# Patient Record
Sex: Male | Born: 1975 | Race: Black or African American | Hispanic: No | Marital: Married | Smoking: Former smoker
Health system: Southern US, Community
[De-identification: ages and names within clinical notes are randomized; demographics above are authoritative.]

---

## 2007-04-12 ENCOUNTER — Ambulatory Visit: Payer: Self-pay | Admitting: Family Medicine

## 2007-04-12 DIAGNOSIS — R3129 Other microscopic hematuria: Secondary | ICD-10-CM

## 2007-04-12 LAB — CONVERTED CEMR LAB
Albumin/Creatinine Ratio, Urine, POC: <30
Bilirubin Urine: NEGATIVE
Blood Glucose, Fasting: 78 mg/dL
Hgb A1c MFr Bld: 6.2 %
Ketones, urine, test strip: NEGATIVE
Nitrite: NEGATIVE
WBC Urine, dipstick: NEGATIVE
pH: 5.5

## 2007-04-13 ENCOUNTER — Encounter: Payer: Self-pay | Admitting: Family Medicine

## 2007-04-13 LAB — CONVERTED CEMR LAB
ALT: 25 units/L (ref 0–53)
AST: 26 units/L (ref 0–37)
Albumin: 4.3 g/dL (ref 3.5–5.2)
CO2: 22 meq/L (ref 19–32)
Calcium: 9.8 mg/dL (ref 8.4–10.5)
Chloride: 103 meq/L (ref 96–112)
Cholesterol: 180 mg/dL (ref 0–200)
Potassium: 4.4 meq/L (ref 3.5–5.3)
TSH: 2.415 microintl units/mL (ref 0.350–5.50)

## 2007-04-14 LAB — CONVERTED CEMR LAB
Bacteria, UA: NONE SEEN
RBC / HPF: NONE SEEN (ref <3)
WBC, UA: NONE SEEN cells/hpf (ref <3)

## 2007-07-02 ENCOUNTER — Encounter: Payer: Self-pay | Admitting: Family Medicine

## 2007-07-16 ENCOUNTER — Ambulatory Visit: Payer: Self-pay | Admitting: Family Medicine

## 2007-07-16 DIAGNOSIS — D72819 Decreased white blood cell count, unspecified: Secondary | ICD-10-CM | POA: Insufficient documentation

## 2007-07-16 LAB — CONVERTED CEMR LAB

## 2007-07-27 ENCOUNTER — Encounter: Payer: Self-pay | Admitting: Family Medicine

## 2007-07-28 LAB — CONVERTED CEMR LAB
Basophils Absolute: 0 10*3/uL (ref 0.0–0.1)
Basophils Relative: 0 % (ref 0–1)
Eosinophils Absolute: 0.2 10*3/uL (ref 0.0–0.7)
MCHC: 33.5 g/dL (ref 30.0–36.0)
MCV: 80.3 fL (ref 78.0–100.0)
Monocytes Relative: 13 % — ABNORMAL HIGH (ref 3–12)
Neutro Abs: 1.4 10*3/uL — ABNORMAL LOW (ref 1.7–7.7)
Neutrophils Relative %: 41 % — ABNORMAL LOW (ref 43–77)
Platelets: 221 10*3/uL (ref 150–400)
RDW: 13.5 % (ref 11.5–15.5)

## 2009-10-25 ENCOUNTER — Ambulatory Visit: Payer: Self-pay | Admitting: Family Medicine

## 2009-10-25 DIAGNOSIS — R0789 Other chest pain: Secondary | ICD-10-CM | POA: Insufficient documentation

## 2009-10-26 ENCOUNTER — Encounter: Payer: Self-pay | Admitting: Family Medicine

## 2009-10-28 LAB — CONVERTED CEMR LAB
Albumin: 3.8 g/dL (ref 3.5–5.2)
Alkaline Phosphatase: 50 units/L (ref 39–117)
Calcium: 9.1 mg/dL (ref 8.4–10.5)
Chloride: 105 meq/L (ref 96–112)
Glucose, Bld: 92 mg/dL (ref 70–99)
LDL Cholesterol: 89 mg/dL (ref 0–99)
MCHC: 35 g/dL (ref 30.0–36.0)
Potassium: 4.3 meq/L (ref 3.5–5.3)
RBC: 5.32 M/uL (ref 4.22–5.81)
Sodium: 137 meq/L (ref 135–145)
Total Protein: 7.2 g/dL (ref 6.0–8.3)
Triglycerides: 231 mg/dL — ABNORMAL HIGH (ref <150)
WBC: 3.2 10*3/uL — ABNORMAL LOW (ref 4.0–10.5)

## 2009-10-29 ENCOUNTER — Encounter: Payer: Self-pay | Admitting: Family Medicine

## 2010-02-12 NOTE — Assessment & Plan Note (Signed)
Summary: Fasting CPE   Vital Signs:  Patient profile:   35 year old male Height:      71 inches Weight:      300 pounds BMI:     41.99 Pulse rate:   82 / minute BP sitting:   124 / 68  (right arm) Cuff size:   large  Vitals Entered By: Avon Gully CMA, Duncan Dull) (October 25, 2009 11:12 AM) CC: CPE   Primary Care Provider:  Linford Arnold, c  CC:  CPE.  History of Present Illness: Here for CPE.  Occ SOBclimbing up and down start.  Occ CP. No assoc with the SOB. Occ will get the left side of shooin pain just for a second. Can happen at rest or acitivity. Occ gets heartburn.   Current Medications (verified): 1)  None  Allergies (verified): No Known Drug Allergies  Comments:  Nurse/Medical Assistant: The patient's medications and allergies were reviewed with the patient and were updated in the Medication and Allergy Lists. Avon Gully CMA, Duncan Dull) (October 25, 2009 11:12 AM)  Past History:  Past Surgical History: Last updated: 04/12/2007 None  Family History: Reviewed history from 04/12/2007 and no changes required. Mother with Cancer Uncle with MI Father, sister with DM Father, sister, brother with HTN  Social History: Foreman  job.  HS degree. Married to [employer] with 5 children.  He doesn't have any kids with his current wife.   Former Smoker Alcohol use-no Drug use-no Regular exercise-no  Review of Systems       The patient complains of weight loss and chest pain.  The patient denies anorexia, fever, weight gain, vision loss, decreased hearing, syncope, dyspnea on exertion, headaches, hemoptysis, abdominal pain, melena, severe indigestion/heartburn, hematuria, incontinence, muscle weakness, suspicious skin lesions, difficulty walking, and depression.         Occ SOBclimbing up and down start.  Occ CP. No assoc with the SOB. Occ will get the left side of shooin pain just for a second. Can happen at rest or acitivity. Occ gets heartburn.   Physical  Exam  General:  Well-developed,well-nourished,in no acute distress; alert,appropriate and cooperative throughout examination Head:  Normocephalic and atraumatic without obvious abnormalities. No apparent alopecia or balding. Eyes:  No corneal or conjunctival inflammation noted. EOMI. Perrla.  Ears:  External ear exam shows no significant lesions or deformities.  Otoscopic examination reveals clear canals, tympanic membranes are intact bilaterally without bulging, retraction, inflammation or discharge. Hearing is grossly normal bilaterally. Nose:  External nasal examination shows no deformity or inflammation. Nasal mucosa are pink and moist without lesions or exudates. Mouth:  Oral mucosa and oropharynx without lesions or exudates.  Teeth in good repair. Neck:  No deformities, masses, or tenderness noted. Chest Wall:  No deformities, masses, tenderness or gynecomastia noted. Lungs:  Normal respiratory effort, chest expands symmetrically. Lungs are clear to auscultation, no crackles or wheezes. Heart:  Normal rate and regular rhythm. S1 and S2 normal without gallop, murmur, click, rub or other extra sounds. NO carotid or abdominal bruits.  Abdomen:  Bowel sounds positive,abdomen soft and non-tender without masses, organomegaly or hernias noted. Msk:  No deformity or scoliosis noted of thoracic or lumbar spine.  Nodule on the first finger of the right hand B/T the MCP and theh PIP joint.  It is mobile and notender and very round.  Pulses:  R and L carotid,radial,dorsalis pedis and posterior tibial pulses are full and equal bilaterally Extremities:  No clubbing, cyanosis, edema, or deformity noted with normal  full range of motion of all joints.   Neurologic:  No cranial nerve deficits noted. Station and gait are normal. Plantar reflexes are down-going bilaterally. DTRs are symmetrical throughout. Sensory, motor and coordinative functions appear intact. Skin:  Intact without suspicious lesions or  rashes Cervical Nodes:  No lymphadenopathy noted Psych:  Cognition and judgment appear intact. Alert and cooperative with normal attention span and concentration. No apparent delusions, illusions, hallucinations   Impression & Recommendations:  Problem # 1:  HEALTH MAINTENANCE EXAM (ICD-V70.0)  Encouraged weight loss and regular exercise. He has gainedl 22 lbs since I last saw him.  Tdap and flu vaccines given.  Orders: T-Comprehensive Metabolic Panel [id number]) T-Lipid Profile ([id number]) T-CBC No Diff ([id number]) EKG w/ Interpretation (93000)  Reviewed preventive care protocols, scheduled due services, and updated immunizations.  Problem # 2:  CHEST PAIN, ATYPICAL (ICD-786.59) No consistant with heart disease. If sxs change or become more frequent then follow up.  EKG shows NSR, rate of 72 bpm.  Likely his GERD.  Discussed dietary measures and can start Zantac two times a day if needed.  Orders: T-CBC No Diff ([id number]) EKG w/ Interpretation (93000)  Other Orders: Flu Vaccine 64yrs + (28315) Admin 1st Vaccine (17616) Tdap => 41yrs IM (07371) Admin of Any Addtl Vaccine (06269)  Patient Instructions: 1)  It is important that you exercise reguarly at least 20 minutes 5 times a week. If you develop chest pain, have severe difficulty breathing, or feel very tired, stop exercising immediately and seek medical attention.  2)  You need to lose weight. Consider a lower calorie diet and regular exercise.  3)  We will call you  with your lab results.  4)  For your reflux avoid greasy, spicey foods and carbonated beverages and caffine. Can start zantac two times a day for symptoms if dietary changes alone don't help.     Immunizations Administered:  Influenza Vaccine # 1:    Vaccine Type: Fluvax 3+    Site: left deltoid    Mfr: GlaxoSmithKline    Dose: 0.5 ml    Route: IM    Given by: Sue Lush McCrimmon CMA, (AAMA)    Exp. Date: 07/13/2010    Lot #: [id number]    VIS  given: 08/07/09 version given October 25, 2009.  Tetanus Vaccine:    Vaccine Type: Tdap    Site: right deltoid    Mfr: GlaxoSmithKline    Dose: 0.5 ml    Route: IM    Given by: Avon Gully CMA, (AAMA)    VIS given: 12/01/07 version given October 25, 2009.  Flu Vaccine Consent Questions:    Do you have a history of severe allergic reactions to this vaccine? no    Any prior history of allergic reactions to egg and/or gelatin? no    Do you have a sensitivity to the preservative Thimersol? no    Do you have a past history of Guillan-Barre Syndrome? no    Do you currently have an acute febrile illness? no    Have you ever had a severe reaction to latex? no    Vaccine information given and explained to patient? no

## 2010-02-12 NOTE — Letter (Signed)
Summary: Generic Letter  [facility] [place]  [address]   [place], [place] [address]   Phone: [phone]  Fax: [phone]    10/29/2009  Manuel Ayers [address] [place], [place]  [address]  Dear [name],    We have received your lab results back and tried to reach you but number has been disconnected.  Your labs are OK except your triglycerides are high and your good cholesterol (HDL) is low, although they are slightly better than in 2009. I strongly recommend that you start Fish Oil 1000mg  capsules 3-4 capsules daily for this- these are over the counter. Lets recheck these labs in 6 months.  Please contact our office with a new contact number for you.       Sincerely,   Nani Gasser, MD

## 2010-09-02 ENCOUNTER — Encounter: Payer: Self-pay | Admitting: Family Medicine

## 2010-09-05 ENCOUNTER — Ambulatory Visit (INDEPENDENT_AMBULATORY_CARE_PROVIDER_SITE_OTHER): Payer: Managed Care, Other (non HMO) | Admitting: Family Medicine

## 2010-09-05 ENCOUNTER — Encounter: Payer: Self-pay | Admitting: Family Medicine

## 2010-09-05 ENCOUNTER — Telehealth: Payer: Self-pay | Admitting: Family Medicine

## 2010-09-05 DIAGNOSIS — Z833 Family history of diabetes mellitus: Secondary | ICD-10-CM

## 2010-09-05 DIAGNOSIS — I10 Essential (primary) hypertension: Secondary | ICD-10-CM

## 2010-09-05 DIAGNOSIS — R1013 Epigastric pain: Secondary | ICD-10-CM

## 2010-09-05 DIAGNOSIS — Z13220 Encounter for screening for lipoid disorders: Secondary | ICD-10-CM

## 2010-09-05 MED ORDER — OMEPRAZOLE 20 MG PO CPDR: 20.0000 mg | DELAYED_RELEASE_CAPSULE | Freq: Every day | ORAL | Status: DC

## 2010-09-05 MED ORDER — LISINOPRIL 20 MG PO TABS: 20.0000 mg | ORAL_TABLET | Freq: Every day | ORAL | Status: DC

## 2010-09-05 NOTE — Telephone Encounter (Signed)
Call pt: let hime know I sent over omeprazole for his stomach. Sorry sent to late.

## 2010-09-05 NOTE — Progress Notes (Signed)
  Subjective:    Patient ID: Manuel Ayers, male    DOB: Mar 16, 1975, 35 y.o.   MRN: [id number]  HPI Burning sensation in the epigastric area after ate that lasted 3 days. No Nausea. Says hx of ulcer. Had green stools about 1.5 weeks before the stomach pain.  No Diarrhea or constipation.  This week hasn't had pain. No recent NSAIDs.  Has been having a lot of gas daily for the last 1-2 weeks.   No fever or vomiting.  No worsening or alleviating sxs. Says pain did start after eating kraft cheese. Says no prior hx or lactose intolerance.    Also elevated BP.  He says BP has been running in the 150s for several weeks. Says today's BP is the best it has been. +fam hx.  Tried to start phentermine for weiht loss and says had to stop it becaues caused him to have tremor and head started throbbing and says his BP was out of control. No hx thyroid dx  Review of Systems     BP 139/89  Pulse 73  Wt 286 lb (129.729 kg)    No Known Allergies  History reviewed. No pertinent past medical history.  History reviewed. No pertinent past surgical history.  History   Social History  . Marital Status: Married    Spouse Name: N/A    Number of Children: N/A  . Years of Education: N/A   Occupational History  . Not on file.   Social History Main Topics  . Smoking status: Former Games developer  . Smokeless tobacco: Not on file  . Alcohol Use: No  . Drug Use: No  . Sexually Active:      no rerular exercise   Other Topics Concern  . Not on file   Social History Narrative  . No narrative on file    Family History  Problem Relation Age of Onset  . Cancer Mother   . Heart attack      uncle  . Diabetes Father   . Hypertension Father   . Diabetes Sister   . Hypertension Sister   . Hypertension Brother   . Kidney failure Sister     Mr. Sara does not currently have medications on file.  Objective:   Physical Exam  Constitutional: He is oriented to person, place, and time. He appears well-developed and  well-nourished.  HENT:  Head: Normocephalic and atraumatic.  Cardiovascular: Normal rate, regular rhythm and normal heart sounds.   Pulmonary/Chest: Effort normal and breath sounds normal.  Abdominal: Soft. Bowel sounds are normal. He exhibits no distension and no mass. There is tenderness. There is no rebound and no guarding.       Diffuse tenderness with deep palpation.    Neurological: He is alert and oriented to person, place, and time.  Skin: Skin is warm and dry.  Psychiatric: He has a normal mood and affect. His behavior is normal.          Assessment & Plan:  Epigastric Pain -resolved but very tender on exam today. Consider diverticulitis. Will check WBC.  Consider GERD as well. STart PPI and taper after 6 weeks. Also conisder mild case of colitis that is resolving on its own. If pain recurs consider cholecystitis.

## 2010-09-05 NOTE — Assessment & Plan Note (Signed)
New dx. Discussed start lisinopril. Discussed potential SE.  rec weight loss, exercise, low salt diet. REc DASH diet. He says he thinks his wife has a copy at home.

## 2010-09-06 ENCOUNTER — Other Ambulatory Visit: Payer: Self-pay | Admitting: Family Medicine

## 2010-09-06 ENCOUNTER — Telehealth: Payer: Self-pay | Admitting: Family Medicine

## 2010-09-06 DIAGNOSIS — R1013 Epigastric pain: Secondary | ICD-10-CM

## 2010-09-06 LAB — COMPLETE METABOLIC PANEL WITH GFR
Albumin: 4.3 g/dL (ref 3.5–5.2)
BUN: 15 mg/dL (ref 6–23)
CO2: 24 mEq/L (ref 19–32)
Calcium: 9.5 mg/dL (ref 8.4–10.5)
Chloride: 107 mEq/L (ref 96–112)
GFR, Est Non African American: >60 mL/min (ref >60)
Glucose, Bld: 85 mg/dL (ref 70–99)
Potassium: 4.2 mEq/L (ref 3.5–5.3)

## 2010-09-06 LAB — CBC WITH DIFFERENTIAL/PLATELET
HCT: 43.1 % (ref 39.0–52.0)
Hemoglobin: 14.6 g/dL (ref 13.0–17.0)
Lymphocytes Relative: 40 % (ref 12–46)
Monocytes Absolute: 0.5 10*3/uL (ref 0.1–1.0)
Monocytes Relative: 12 % (ref 3–12)
Neutro Abs: 1.8 10*3/uL (ref 1.7–7.7)
WBC: 4 10*3/uL (ref 4.0–10.5)

## 2010-09-06 LAB — TSH: TSH: 2.693 u[IU]/mL (ref 0.350–4.500)

## 2010-09-06 LAB — GLUCOSE, RANDOM: Glucose, Bld: 81 mg/dL (ref 70–99)

## 2010-09-06 LAB — LIPID PANEL: HDL: 38 mg/dL — ABNORMAL LOW (ref >39)

## 2010-09-06 NOTE — Telephone Encounter (Signed)
Called home number and its d/c.called cell and not sure if this is correct number

## 2010-09-06 NOTE — Telephone Encounter (Signed)
Call patient: Complete metabolic panel is normal.His liver enzymes are normal. His complete blood count was normal showing that there is no sign of infection which is very reassuring. Thyroid was normal as well.

## 2010-09-06 NOTE — Telephone Encounter (Signed)
Called both numbers and i think they are the wrong num

## 2010-09-07 ENCOUNTER — Telehealth: Payer: Self-pay | Admitting: Family Medicine

## 2010-09-07 NOTE — Telephone Encounter (Signed)
Call pt: TG are better than last time and HDL is better, but LDL is a little worse. Continue to work on exercise, and diet and weight loss and recheck in 1 year.  Complete blood count is normal. Blood sugar and thyroid look great. CMPis normal as well. H pylori test is not back yet.

## 2010-09-09 ENCOUNTER — Telehealth: Payer: Self-pay | Admitting: Family Medicine

## 2010-09-09 MED ORDER — CLARITHROMYCIN 500 MG PO TABS: 500.0000 mg | ORAL_TABLET | Freq: Two times a day (BID) | ORAL | Status: AC

## 2010-09-09 MED ORDER — AMOXICILLIN 500 MG PO TABS: 1000.0000 mg | ORAL_TABLET | Freq: Two times a day (BID) | ORAL | Status: AC

## 2010-09-09 NOTE — Telephone Encounter (Signed)
Call pt: He is + for helicobacter pylori. Will call in tx. The tx is 3 meds. One is the omeprazole so make sure taking that every day while on the 2 antiobiotics for 10 days.

## 2010-09-11 NOTE — Telephone Encounter (Signed)
Pt.notified

## 2010-09-11 NOTE — Telephone Encounter (Signed)
Pt called and was notified of results and changed his contact info in comp

## 2010-09-11 NOTE — Telephone Encounter (Signed)
LMOM (cellular #) to call office to get lab results.  Did not leave results on voice mail since the greeting states someone else name.  Home # is saying it is no longer in service. Jarvis Newcomer, LPN Domingo Dimes

## 2010-09-11 NOTE — Telephone Encounter (Signed)
Pt has been notified.

## 2010-10-21 ENCOUNTER — Other Ambulatory Visit: Payer: Self-pay | Admitting: Family Medicine

## 2010-10-21 MED ORDER — LISINOPRIL 20 MG PO TABS: 20.0000 mg | ORAL_TABLET | Freq: Every day | ORAL | Status: DC

## 2010-10-21 NOTE — Telephone Encounter (Signed)
Pt;s wife called for refill of pts med lisinopril 20 mg.  Needs it sent to [facility] [place], [place].   Plan:  Refill sent for # 30/ 1 refill and pt wife informed. Jarvis Newcomer, LPN Domingo Dimes

## 2010-11-30 ENCOUNTER — Other Ambulatory Visit: Payer: Self-pay | Admitting: Family Medicine

## 2011-01-10 ENCOUNTER — Ambulatory Visit (INDEPENDENT_AMBULATORY_CARE_PROVIDER_SITE_OTHER): Payer: Managed Care, Other (non HMO) | Admitting: Family Medicine

## 2011-01-10 ENCOUNTER — Encounter: Payer: Self-pay | Admitting: Family Medicine

## 2011-01-10 ENCOUNTER — Ambulatory Visit
Admission: RE | Admit: 2011-01-10 | Discharge: 2011-01-10 | Disposition: A | Discharge status: Home / Self Care | Payer: Managed Care, Other (non HMO) | Source: Ambulatory Visit | Attending: Family Medicine | Admitting: Family Medicine

## 2011-01-10 VITALS — BP 133/89 | HR 89 | Wt 277.0 lb

## 2011-01-10 DIAGNOSIS — M79609 Pain in unspecified limb: Secondary | ICD-10-CM

## 2011-01-10 DIAGNOSIS — Z23 Encounter for immunization: Secondary | ICD-10-CM

## 2011-01-10 DIAGNOSIS — M79672 Pain in left foot: Secondary | ICD-10-CM

## 2011-01-10 DIAGNOSIS — R05 Cough: Secondary | ICD-10-CM

## 2011-01-10 DIAGNOSIS — I10 Essential (primary) hypertension: Secondary | ICD-10-CM

## 2011-01-10 MED ORDER — LOSARTAN POTASSIUM 25 MG PO TABS: 25.0000 mg | ORAL_TABLET | Freq: Every day | ORAL | Status: DC

## 2011-01-10 MED ORDER — LISINOPRIL-HYDROCHLOROTHIAZIDE 10-12.5 MG PO TABS: 1.0000 | ORAL_TABLET | Freq: Every day | ORAL | Status: DC

## 2011-01-10 NOTE — Patient Instructions (Signed)
Return on Monday to have your TB skin test read.    1.5 Gram Low Sodium Diet A 1.5 gram sodium diet restricts the amount of sodium in the diet to no more than 1.5 g or 1500 mg daily. The American Heart Association recommends Americans over the age of 103 to consume no more than 1500 mg of sodium each day to reduce the risk of developing high blood pressure. Research also shows that limiting sodium may reduce heart attack and stroke risk. Many foods contain sodium for flavor and sometimes as a preservative. When the amount of sodium in a diet needs to be low, it is important to know what to look for when choosing foods and drinks. The following includes some information and guidelines to help make it easier for you to adapt to a low sodium diet. QUICK TIPS  Do not add salt to food.     Avoid convenience items and fast food.     Choose unsalted snack foods.     Buy lower sodium products, often labeled as "lower sodium" or "no salt added."     Check food labels to learn how much sodium is in 1 serving.     When eating at a restaurant, ask that your food be prepared with less salt or none, if possible.  READING FOOD LABELS FOR SODIUM INFORMATION The nutrition facts label is a good place to find how much sodium is in foods. Look for products with no more than 400 mg of sodium per serving. Remember that 1.5 g = 1500 mg. The food label may also list foods as:  Sodium-free: Less than 5 mg in a serving.     Very low sodium: 35 mg or less in a serving.     Low-sodium: 140 mg or less in a serving.     Light in sodium: 50% less sodium in a serving. For example, if a food that usually has 300 mg of sodium is changed to become light in sodium, it will have 150 mg of sodium.     Reduced sodium: 25% less sodium in a serving. For example, if a food that usually has 400 mg of sodium is changed to reduced sodium, it will have 300 mg of sodium.  CHOOSING FOODS Grains  Avoid: Salted crackers and snack  items. Some cereals, including instant hot cereals. Bread stuffing and biscuit mixes. Seasoned rice or pasta mixes.     Choose: Unsalted snack items. Low-sodium cereals, oats, puffed wheat and rice, shredded wheat. English muffins and bread. Pasta.  Meats  Avoid: Salted, canned, smoked, spiced, pickled meats, including fish and poultry. Bacon, ham, sausage, cold cuts, hot dogs, anchovies.     Choose: Low-sodium canned tuna and salmon. Fresh or frozen meat, poultry, and fish.  Dairy  Avoid: Processed cheese and spreads. Cottage cheese. Buttermilk and condensed milk. Regular cheese.     Choose: Milk. Low-sodium cottage cheese. Yogurt. Sour cream. Low-sodium cheese.  Fruits and Vegetables  Avoid: Regular canned vegetables. Regular canned tomato sauce and paste. Frozen vegetables in sauces. Olives. Rosita Fire. Relishes. Sauerkraut.     Choose: Low-sodium canned vegetables. Low-sodium tomato sauce and paste. Frozen or fresh vegetables. Fresh and frozen fruit.  Condiments  Avoid: Canned and packaged gravies. Worcestershire sauce. Tartar sauce. Barbecue sauce. Soy sauce. Steak sauce. Ketchup. Onion, garlic, and table salt. Meat flavorings and tenderizers.     Choose: Fresh and dried herbs and spices. Low-sodium varieties of mustard and ketchup. Lemon juice. Tabasco sauce. Horseradish.  SAMPLE 1.5 GRAM SODIUM MEAL PLAN Breakfast / Sodium (mg)  1 cup low-fat milk / 143 mg     1 whole-wheat English muffin / 240 mg     1 tbs heart-healthy margarine / 153 mg     1 hard-boiled egg / 139 mg     1 small orange / 0 mg  Lunch / Sodium (mg)  1 cup raw carrots / 76 mg     2 tbs no salt added peanut butter / 5 mg     2 slices whole-wheat bread / 270 mg     1 tbs jelly / 6 mg      cup red grapes / 2 mg  Dinner / Sodium (mg)  1 cup whole-wheat pasta / 2 mg     1 cup low-sodium tomato sauce / 73 mg     3 oz lean ground beef / 57 mg     1 small side salad (1 cup raw spinach leaves,  cup  cucumber,  cup yellow bell pepper) with 1 tsp olive oil and 1 tsp red wine vinegar / 25 mg  Snack / Sodium (mg)  1 container low-fat vanilla yogurt / 107 mg     3 graham cracker squares / 127 mg  Nutrient Analysis  Calories: 1745     Protein: 75 g     Carbohydrate: 237 g     Fat: 57 g     Sodium: 1425 mg  Document Released: 12/30/2004 Document Revised: 09/11/2010 Document Reviewed: 04/02/2009 [facility] Patient Information 2012 [place], [place].

## 2011-01-10 NOTE — Progress Notes (Signed)
  Subjective:    Patient ID: Manuel Ayers, male    DOB: [birth year], 35 y.o.   MRN: [id number]  HPI Here for ED f/u Went 2 days ago to ED for HTN. BP was in the 150s. Felt nauseated. After went home vomited and then felt better. Seen in August for BP and started on lisinopril but never followed up. [place] mostly [place] out of state At ED given HCTZ and potassium. Started these for last 2 days. Says feels it is making his heart pound. Doesn't like how he feels on it. No SOB or LE edema.   Cough started 4 weeks ago. Dry frequent cough. He is worried about TB as used to work around a guy years ago who constantly coughed and then later found out he had TB.  Says ran out of his ACE about 2  Weeks ago and cough has been gradually getting better over the last 2 weeks.   Left foot pain - Start maybe a month ago. Pain over middle of foot. Never red or swollen. Says went to local doc in [place]. Had xray, told may have fracture but then given meds for gout. Start the colchine and says she got better after about a week. Then started the allopurinol and pain started again. Pain was severe and foot was tender. No prior hx of gout.     Review of Systems     Objective:   Physical Exam  Constitutional: He is oriented to person, place, and time. He appears well-developed and well-nourished.  HENT:  Head: Normocephalic and atraumatic.  Right Ear: External ear normal.  Left Ear: External ear normal.  Nose: Nose normal.  Mouth/Throat: Oropharynx is clear and moist.       TMs and canals are clear.   Eyes: Conjunctivae and EOM are normal. Pupils are equal, round, and reactive to light.  Neck: Neck supple. No thyromegaly present.  Cardiovascular: Normal rate, regular rhythm and normal heart sounds.        No carotid bruits.   Pulmonary/Chest: Effort normal and breath sounds normal.  Lymphadenopathy:    He has no cervical adenopathy.  Neurological: He is alert and oriented to person, place, and time.  Skin: Skin is warm  and dry.  Psychiatric: He has a normal mood and affect. His behavior is normal.          Assessment & Plan:  HTN - Well controlled today but doesn't like how he feels on HCTZ.  Will change to ARB since his cough may be ACE related. Repeat check BP in one week before he leaves town again.   Cough - Will place TB skin test. Read on Monday. Likley from ACE.  Chest exam is normal. If cough doesn't resolved consider CXR.    Left foot pain - Unclear if fracture or gout. Will get repeat  Xray today.  Will also check uric acid level.  Will call with results. Hold the allopurinol. Has been off it for a week anyway. Pain is under control righ tnow as he has been able to rest his foot over the Holidays.

## 2011-01-11 LAB — BASIC METABOLIC PANEL WITH GFR
BUN: 19 mg/dL (ref 6–23)
CO2: 23 mEq/L (ref 19–32)
Chloride: 98 mEq/L (ref 96–112)
Creat: 1.3 mg/dL (ref 0.50–1.35)
Glucose, Bld: 98 mg/dL (ref 70–99)
Potassium: 4.4 mEq/L (ref 3.5–5.3)

## 2011-01-13 ENCOUNTER — Ambulatory Visit: Payer: Managed Care, Other (non HMO) | Admitting: Family Medicine

## 2011-01-20 ENCOUNTER — Ambulatory Visit (INDEPENDENT_AMBULATORY_CARE_PROVIDER_SITE_OTHER): Payer: Managed Care, Other (non HMO) | Admitting: Family Medicine

## 2011-01-20 ENCOUNTER — Encounter: Payer: Self-pay | Admitting: Family Medicine

## 2011-01-20 DIAGNOSIS — I10 Essential (primary) hypertension: Secondary | ICD-10-CM

## 2011-01-20 NOTE — Progress Notes (Signed)
  Subjective:    Patient ID: Manuel Ayers, male    DOB: 1975/10/31, 36 y.o.   MRN: [id number]  HPI HTN- Says stopped bp med 2 days ago bc of sexual side effects. He says he has cut pork and fried foods out of his diet and is doing well.  Had cough on ACEi.     Review of Systems     Objective:   Physical Exam  Constitutional: He is oriented to person, place, and time. He appears well-developed and well-nourished.  HENT:  Head: Normocephalic and atraumatic.  Eyes: Conjunctivae are normal. Pupils are equal, round, and reactive to light.  Cardiovascular: Normal rate, regular rhythm and normal heart sounds.   Pulmonary/Chest: Effort normal and breath sounds normal.  Neurological: He is alert and oriented to person, place, and time.  Skin: Skin is warm and dry.  Psychiatric: He has a normal mood and affect. His behavior is normal.          Assessment & Plan:  HTN- will change to Benicar 20mg  daily. Discussed that if continue to really work on diet and exercise and dec salt intake then may be able to avoid taking BP meds.  He will continue to work on this.  For now he can call me in 2 weeks and let  Me know if still getting sexual side effects. If so then consider betablocker or calcium channel blocker.

## 2011-02-03 ENCOUNTER — Telehealth: Payer: Self-pay | Admitting: *Deleted

## 2011-02-03 DIAGNOSIS — M79673 Pain in unspecified foot: Secondary | ICD-10-CM

## 2011-02-03 NOTE — Telephone Encounter (Signed)
Pt called to say the last bp pill rx'ed is working for him and needs a refill if you want him to continue it

## 2011-02-04 ENCOUNTER — Other Ambulatory Visit: Payer: Self-pay | Admitting: *Deleted

## 2011-02-04 MED ORDER — DICLOFENAC SODIUM 75 MG PO TBEC: 75.0000 mg | DELAYED_RELEASE_TABLET | Freq: Two times a day (BID) | ORAL | Status: DC

## 2011-02-04 MED ORDER — OLMESARTAN MEDOXOMIL 20 MG PO TABS: 20.0000 mg | ORAL_TABLET | Freq: Every day | ORAL | Status: DC

## 2011-02-04 NOTE — Telephone Encounter (Signed)
OK to refill, but still needs f/u appt if doesn't have one. If has one then ok to refill until then.

## 2011-02-04 NOTE — Telephone Encounter (Signed)
Pt called and wanted refill on BP med. The only one I see is the Cozaar on the med list and this is a contraindication due to cough. In your last office visit note you said you would start Benicar 20mg  daily but do not see this on med list. Please advise.

## 2011-02-04 NOTE — Telephone Encounter (Signed)
Already taken care of by another staff member.

## 2011-02-04 NOTE — Telephone Encounter (Signed)
Refilled Benicar 20 mg;Pt states he is having foot pain and needs something for it. Saw lab about uric acid level and it was noted that it was not that elevated so pt was to hold off on the allopurinol.Please advise

## 2011-02-04 NOTE — Telephone Encounter (Signed)
Will refer to podiatry. Will call in NSAID since colcicine helped in the past.

## 2011-02-05 NOTE — Telephone Encounter (Signed)
Pt.notified

## 2011-02-19 ENCOUNTER — Encounter: Payer: Self-pay | Admitting: Family Medicine

## 2011-02-20 ENCOUNTER — Encounter: Payer: Self-pay | Admitting: Family Medicine

## 2011-07-11 ENCOUNTER — Encounter: Payer: Self-pay | Admitting: *Deleted

## 2011-07-18 ENCOUNTER — Ambulatory Visit (INDEPENDENT_AMBULATORY_CARE_PROVIDER_SITE_OTHER): Payer: Managed Care, Other (non HMO)

## 2011-07-18 ENCOUNTER — Encounter: Payer: Self-pay | Admitting: Physician Assistant

## 2011-07-18 ENCOUNTER — Ambulatory Visit (INDEPENDENT_AMBULATORY_CARE_PROVIDER_SITE_OTHER): Payer: Managed Care, Other (non HMO) | Admitting: Physician Assistant

## 2011-07-18 VITALS — BP 133/84 | HR 79 | Ht 72.0 in | Wt 299.0 lb

## 2011-07-18 DIAGNOSIS — E785 Hyperlipidemia, unspecified: Secondary | ICD-10-CM

## 2011-07-18 DIAGNOSIS — M79672 Pain in left foot: Secondary | ICD-10-CM

## 2011-07-18 DIAGNOSIS — Z131 Encounter for screening for diabetes mellitus: Secondary | ICD-10-CM

## 2011-07-18 DIAGNOSIS — M79609 Pain in unspecified limb: Secondary | ICD-10-CM

## 2011-07-18 DIAGNOSIS — I10 Essential (primary) hypertension: Secondary | ICD-10-CM

## 2011-07-18 MED ORDER — OLMESARTAN MEDOXOMIL 20 MG PO TABS: 20.0000 mg | ORAL_TABLET | Freq: Every day | ORAL | Status: DC

## 2011-07-18 MED ORDER — IBUPROFEN 800 MG PO TABS: 800.0000 mg | ORAL_TABLET | Freq: Three times a day (TID) | ORAL | Status: AC | PRN

## 2011-07-18 NOTE — Patient Instructions (Addendum)
Can take Ibuprofen 800mg  up to three times a day as needed for heel pain.   Heel Spur    A heel spur is a hook of bone that can form on the calcaneus (the heel bone and the largest bone of the foot). Heel spurs are often associated with plantar fasciitis and usually come in people who have had the problem for an extended period of time. The cause of the relationship is unknown. The pain associated with them is thought to be caused by an inflammation (soreness and redness) of the plantar fascia rather than the spur itself. The plantar fascia is a thick fibrous like tissue that runs from the calcaneus (heel bone) to the ball of the foot. This strong, tight tissue helps maintain the arch of your foot. It helps distribute the weight across your foot as you walk or run. Stresses placed on the plantar fascia can be tremendous. When it is inflamed normal activities become painful. Pain is worse in the morning after sleeping. After sleeping the plantar fascia is tight. The first movements stretch the fascia and this causes pain. As the tendon loosens, the pain usually gets better. It often returns with too much standing or walking.  About 70% of patients with plantar fasciitis have a heel spur. About half of people without foot pain also have heel spurs. DIAGNOSIS  The diagnosis of a heel spur is made by X-ray. The X-ray shows a hook of bone protruding from the bottom of the calcaneus at the point where the plantar fascia is attached to the heel bone.  TREATMENT  It is necessary to find out what is causing the stretching of the plantar fascia. If the cause is over-pronation (flat feet), orthotics and proper foot ware may help.   Stretching exercises, losing weight, wearing shoes that have a cushioned heel that absorbs shock, and elevating the heel with the use of a heel cradle, heel cup, or orthotics may all help. Heel cradles and heel cups provide extra comfort and cushion to the heel, and reduce the  amount of shock to the sore area.  AVOIDING THE PAIN OF PLANTAR FASCIITIS AND HEEL SPURS  Consult a sports medicine professional before beginning a new exercise program.   Walking programs offer a good workout. There is a lower chance of overuse injuries common to the runners. There is less impact and less jarring of the joints.   Begin all new exercise programs slowly. If problems or pains develop, decrease the amount of time or distance until you are at a comfortable level.   Wear good shoes and replace them regularly.   Stretch your foot and the heel cords at the back of the ankle (Achilles tendons) both before and after exercise.   Run or exercise on even surfaces that are not hard. For example, asphalt is better than pavement.   Do not run barefoot on hard surfaces.   If using a treadmill, vary the incline.   Do not continue to workout if you have foot or joint problems. Seek professional help if they do not improve.  HOME CARE INSTRUCTIONS   Avoid activities that cause you pain until you recover.   Use ice or cold packs to the problem or painful areas after working out.   Only take over-the-counter or prescription medicines for pain, discomfort, or fever as directed by your caregiver.   Soft shoe inserts or athletic shoes with air or gel sole cushions may be helpful.   If problems continue  or become more severe, consult a sports medicine caregiver. Cortisone is a potent anti-inflammatory medication that may be injected into the painful area. You can discuss this treatment with your caregiver.  MAKE SURE YOU:   Understand these instructions.   Will watch your condition.   Will get help right away if you are not doing well or get worse.  Document Released: 02/05/2005 Document Revised: 12/19/2010 Document Reviewed: 04/09/2005 [facility] Patient Information 2012 [place], [place].

## 2011-07-19 LAB — LIPID PANEL
HDL: 36 mg/dL — ABNORMAL LOW (ref >39)
LDL Cholesterol: 100 mg/dL — ABNORMAL HIGH (ref 0–99)
Total CHOL/HDL Ratio: 5.1 Ratio
Triglycerides: 246 mg/dL — ABNORMAL HIGH (ref <150)
VLDL: 49 mg/dL — ABNORMAL HIGH (ref 0–40)

## 2011-07-19 LAB — COMPLETE METABOLIC PANEL WITH GFR
ALT: 29 U/L (ref 0–53)
AST: 27 U/L (ref 0–37)
Alkaline Phosphatase: 50 U/L (ref 39–117)
Creat: 1.07 mg/dL (ref 0.50–1.35)
Total Bilirubin: 0.7 mg/dL (ref 0.3–1.2)

## 2011-07-21 NOTE — Progress Notes (Signed)
  Subjective:    Patient ID: Manuel Ayers, male    DOB: June 19, 1975, 36 y.o.   MRN: [id number]  HPI Patient presents to the clinic to follow up on hypertension, hyperlipidemia and to discuss left heel pain. Pt is doing great on Benicar. He has no sexual side effects on Benicar. He denies any CP, HA, SOB, or palpitations. He has been making diet and exercise changes and wants his cholesterol recheck to see if lifestyle changes are helping.  He also has had left heel pain for the last 4 weeks. Pain was 9/10 a couple of weeks ago but has since gotten better to be a 2/10. He has not tried anything to make better. Walking on feet and standing makes worse. Denies any injury or trauma to foot. Denies any swelling of foot. He wears supportive shoes most of the time at work and at home.   Review of Systems     Objective:   Physical Exam  Constitutional: He is oriented to person, place, and time. He appears well-developed and well-nourished.  HENT:  Head: Normocephalic and atraumatic.  Cardiovascular: Normal rate, regular rhythm, normal heart sounds and intact distal pulses.   Pulmonary/Chest: Effort normal and breath sounds normal. He has no wheezes.  Musculoskeletal:       Left heel pain with palpation when directly over the heel. No pain with palpation over the achilles tendon or the middle of the foot. Normal ROM of left ankle. Strength 5/5. Sensation intact. NOrmal color and no warmth/heat coming from foot.   Neurological: He is alert and oriented to person, place, and time.  Skin: Skin is warm and dry.  Psychiatric: He has a normal mood and affect. His behavior is normal.          Assessment & Plan:  HTN- BP almost at goal. Will refill medication and recheck in 6 months. Continue to work on weight loss and avoid salt.   Hyperlipidemia-Will get lipid panel today. Will call pt with results. Continue to exercise and avoid red meats, fried foods.  Left heel pain- Will get x-ray and call with  results. Use Ibuprofen 800mg  up to TID for heel pain. Gave handout of symptomatic care for heel pain. REst can be the best treatment. May be evaluation by podiatrist for orthotics. Wear shoes that are very supportive all the time.

## 2011-07-22 MED ORDER — OMEGA-3-ACID ETHYL ESTERS 1 G PO CAPS: 2.0000 g | ORAL_CAPSULE | Freq: Two times a day (BID) | ORAL | Status: DC

## 2011-07-22 NOTE — Addendum Note (Signed)
Addended by: Jomarie Longs on: 07/22/2011 12:08 PM   Modules accepted: Orders

## 2011-07-22 NOTE — Addendum Note (Signed)
Addended by: Jomarie Longs on: 07/22/2011 12:12 PM   Modules accepted: Orders

## 2011-07-23 ENCOUNTER — Other Ambulatory Visit: Payer: Self-pay | Admitting: *Deleted

## 2011-07-23 MED ORDER — OMEGA-3-ACID ETHYL ESTERS 1 G PO CAPS: 2.0000 g | ORAL_CAPSULE | Freq: Two times a day (BID) | ORAL | Status: DC

## 2011-11-25 ENCOUNTER — Encounter: Payer: Self-pay | Admitting: Family Medicine

## 2011-11-25 ENCOUNTER — Ambulatory Visit (INDEPENDENT_AMBULATORY_CARE_PROVIDER_SITE_OTHER): Payer: Managed Care, Other (non HMO) | Admitting: Family Medicine

## 2011-11-25 ENCOUNTER — Other Ambulatory Visit: Payer: Self-pay | Admitting: *Deleted

## 2011-11-25 VITALS — BP 136/78 | HR 73 | Ht 72.0 in | Wt 297.0 lb

## 2011-11-25 DIAGNOSIS — R55 Syncope and collapse: Secondary | ICD-10-CM

## 2011-11-25 MED ORDER — OMEGA-3-ACID ETHYL ESTERS 1 G PO CAPS: 2.0000 g | ORAL_CAPSULE | Freq: Two times a day (BID) | ORAL | Status: DC

## 2011-11-25 NOTE — Progress Notes (Signed)
Subjective:    Patient ID: Manuel Ayers, male    DOB: Jan 15, 1975, 36 y.o.   MRN: [id number]  HPI He passed out at work yesterday. Has never passed out before. Was standing there talking and laughing and then got lightheaded and passed out. Friend says he laid there about 5 minutes. ays could still see but felt like he couldn't move.  No HA, no sleep problems. Had a chset discomfort with yawning about 3 days ago.  No SOB.  No palpitations. Hasn't felt bad since then. Only felt weak for about 1.5 hours after passed out.  Had normal CXR and had normal EKG of chest.  They did labs but won't be back until tomorrow.  No diaphoresis. No chest pain since the slight twinging in the left side of his chest after yawning or Sunday. No GI symptoms. He did eat that day. He did not feel dehydrated. Fortunately he actually fell forward when he passed out when he would just gone over a railing and fALLEN almost 300 feet.   Review of Systems BP 136/78  Pulse 73  Ht 6' (1.829 m)  Wt 297 lb (134.718 kg)  BMI 40.28 kg/m2    Allergies  Allergen Reactions  . Lisinopril Cough  . Losartan Other (See Comments)    Sexual dysfunction.     No past medical history on file.  No past surgical history on file.  History   Social History  . Marital Status: Married    Spouse Name: N/A    Number of Children: N/A  . Years of Education: N/A   Occupational History  . Not on file.   Social History Main Topics  . Smoking status: Former Games developer  . Smokeless tobacco: Not on file  . Alcohol Use: No  . Drug Use: No  . Sexually Active:      Comment: no rerular exercise   Other Topics Concern  . Not on file   Social History Narrative  . No narrative on file    Family History  Problem Relation Age of Onset  . Cancer Mother   . Heart attack      uncle  . Diabetes Father   . Hypertension Father   . Diabetes Sister   . Hypertension Sister   . Hypertension Brother   . Kidney failure Sister     Outpatient  Encounter Prescriptions as of 11/25/2011  Medication Sig Dispense Refill  . olmesartan (BENICAR) 20 MG tablet Take 1 tablet (20 mg total) by mouth daily.  30 tablet  5  . omega-3 acid ethyl esters (LOVAZA) 1 G capsule Take 2 capsules (2 g total) by mouth 2 (two) times daily.  120 capsule  4  . [DISCONTINUED] omega-3 acid ethyl esters (LOVAZA) 1 G capsule Take 2 capsules (2 g total) by mouth 2 (two) times daily.  120 capsule  0          Objective:   Physical Exam  Constitutional: He is oriented to person, place, and time. He appears well-developed and well-nourished.  HENT:  Head: Normocephalic and atraumatic.  Right Ear: External ear normal.  Left Ear: External ear normal.  Nose: Nose normal.  Mouth/Throat: Oropharynx is clear and moist.       TMs and canals are clear.   Eyes: Conjunctivae normal and EOM are normal. Pupils are equal, round, and reactive to light.  Neck: Neck supple. No thyromegaly present.  Cardiovascular: Normal rate and normal heart sounds.  No carotid bruits.   Pulmonary/Chest: Effort normal and breath sounds normal.  Lymphadenopathy:    He has no cervical adenopathy.  Neurological: He is alert and oriented to person, place, and time.  Skin: Skin is warm and dry.  Psychiatric: He has a normal mood and affect.          Assessment & Plan:  Syncope- unclear etiology at this point. I do not have records but we are awaiting the fax. Something he had a normal chest x-ray as well as normal orthostatics in a normal EKG. If the blood work all comes back normal then consider further cardiac workup. If we find a cause such as anemia on the blood work and we will evaluate this further and treat. At this point I'm not releasing him back to work at least until I have an opportunity to look at his labs. Blood pressure looks well controlled. It sounds like he was not dehydrated and did not skip a meal. This hypoglycemia is less likely.

## 2011-11-27 ENCOUNTER — Telehealth: Payer: Self-pay | Admitting: Family Medicine

## 2011-11-27 ENCOUNTER — Other Ambulatory Visit: Payer: Self-pay | Admitting: Family Medicine

## 2011-11-27 ENCOUNTER — Telehealth: Payer: Self-pay

## 2011-11-27 DIAGNOSIS — R55 Syncope and collapse: Secondary | ICD-10-CM

## 2011-11-27 DIAGNOSIS — R748 Abnormal levels of other serum enzymes: Secondary | ICD-10-CM

## 2011-11-27 NOTE — Telephone Encounter (Signed)
Call pt: finally got labs yesterday.  All normal except CK, a muscle enzyme, was up a little.  Recommend recheck and want him to see cards for for syncope w/u. Will make referral. Can come to lab tomorrow for repeat lab. Ok to return to work on Monday, but will need time off to see cardiology.

## 2011-11-27 NOTE — Telephone Encounter (Signed)
Manuel Ayers is calling to see if we have received the lab work from [place]?

## 2011-11-28 NOTE — Telephone Encounter (Signed)
Patient stopped by office and did labs today and picked up letter.

## 2011-11-28 NOTE — Telephone Encounter (Signed)
Alfreddie will come by today to pick up letter and for labs.

## 2011-11-29 LAB — CK: Total CK: 293 U/L — ABNORMAL HIGH (ref 7–232)

## 2011-12-01 ENCOUNTER — Other Ambulatory Visit: Payer: Self-pay | Admitting: Family Medicine

## 2011-12-01 LAB — TSH: TSH: 2.233 u[IU]/mL (ref 0.350–4.500)

## 2011-12-01 NOTE — Addendum Note (Signed)
Addended by: Chalmers Cater on: 12/01/2011 09:49 AM   Modules accepted: Orders

## 2012-04-13 ENCOUNTER — Other Ambulatory Visit: Payer: Self-pay | Admitting: Physician Assistant

## 2012-04-13 ENCOUNTER — Other Ambulatory Visit: Payer: Self-pay | Admitting: Family Medicine

## 2012-05-11 ENCOUNTER — Other Ambulatory Visit: Payer: Self-pay | Admitting: Family Medicine

## 2012-05-11 ENCOUNTER — Other Ambulatory Visit: Payer: Self-pay | Admitting: Physician Assistant

## 2012-07-06 ENCOUNTER — Other Ambulatory Visit: Payer: Self-pay | Admitting: *Deleted

## 2012-07-06 MED ORDER — OLMESARTAN MEDOXOMIL 20 MG PO TABS: ORAL_TABLET | ORAL | Status: DC

## 2012-07-15 ENCOUNTER — Encounter: Payer: Self-pay | Admitting: Family Medicine

## 2012-07-15 ENCOUNTER — Ambulatory Visit (INDEPENDENT_AMBULATORY_CARE_PROVIDER_SITE_OTHER): Payer: Managed Care, Other (non HMO) | Admitting: Family Medicine

## 2012-07-15 ENCOUNTER — Ambulatory Visit: Payer: Managed Care, Other (non HMO) | Admitting: Family Medicine

## 2012-07-15 ENCOUNTER — Ambulatory Visit: Payer: Managed Care, Other (non HMO) | Admitting: Sports Medicine

## 2012-07-15 VITALS — BP 123/82 | HR 73 | Ht 72.0 in | Wt 297.0 lb

## 2012-07-15 DIAGNOSIS — E785 Hyperlipidemia, unspecified: Secondary | ICD-10-CM

## 2012-07-15 DIAGNOSIS — I10 Essential (primary) hypertension: Secondary | ICD-10-CM

## 2012-07-15 MED ORDER — OLMESARTAN MEDOXOMIL 20 MG PO TABS: ORAL_TABLET | ORAL | Status: DC

## 2012-07-15 NOTE — Progress Notes (Signed)
  Subjective:    Patient ID: Manuel Ayers, male    DOB: March 02, 1975, 37 y.o.   MRN: [id number]  HPI HTN-  Pt denies chest pain, SOB, dizziness, or heart palpitations.  Taking meds as directed w/o problems.  Denies medication side effects.  Says very physically activ job but struggles with carlries.    Hyperlipidemia-tolerating lovaza well.   Review of Systems No cough.     Objective:   Physical Exam  Constitutional: He is oriented to person, place, and time. He appears well-developed and well-nourished.  HENT:  Head: Normocephalic and atraumatic.  Cardiovascular: Normal rate, regular rhythm and normal heart sounds.   Pulmonary/Chest: Effort normal and breath sounds normal.  Neurological: He is alert and oriented to person, place, and time.  Skin: Skin is warm and dry.  Psychiatric: He has a normal mood and affect. His behavior is normal.          Assessment & Plan:  HTN- well controlled. F/U in 6 months.   Refill sent.  Hyperlipidemia-due to repeat lipids and liver enzymes. Given lab slip. He's leaving town again this weekend. Encouraged him to get his blood work done at a local blood draw station in [place] which is where he works.  Obesity-discussed the importance of weight loss. It sounds like physically he has a very active job. In fact he has a pedometer on his own and at work walks almost 4 miles per day. I think where he really struggles his calorie intake. Encouraged him to try cut back by at least 200-300 calories per day and this should at least help him lose 10-15 pounds.

## 2012-07-19 ENCOUNTER — Ambulatory Visit: Payer: Managed Care, Other (non HMO) | Admitting: Family Medicine

## 2013-01-14 ENCOUNTER — Ambulatory Visit: Payer: Managed Care, Other (non HMO) | Admitting: Physician Assistant

## 2013-01-14 ENCOUNTER — Encounter: Payer: Self-pay | Admitting: Physician Assistant

## 2013-01-14 ENCOUNTER — Ambulatory Visit (INDEPENDENT_AMBULATORY_CARE_PROVIDER_SITE_OTHER): Payer: Managed Care, Other (non HMO) | Admitting: Physician Assistant

## 2013-01-14 ENCOUNTER — Other Ambulatory Visit: Payer: Self-pay | Admitting: *Deleted

## 2013-01-14 VITALS — BP 126/80 | HR 72 | Wt 308.0 lb

## 2013-01-14 DIAGNOSIS — Z131 Encounter for screening for diabetes mellitus: Secondary | ICD-10-CM

## 2013-01-14 DIAGNOSIS — E781 Pure hyperglyceridemia: Secondary | ICD-10-CM

## 2013-01-14 DIAGNOSIS — I10 Essential (primary) hypertension: Secondary | ICD-10-CM

## 2013-01-14 DIAGNOSIS — R42 Dizziness and giddiness: Secondary | ICD-10-CM

## 2013-01-14 LAB — LIPID PANEL
Cholesterol: 179 mg/dL (ref 0–200)
HDL: 40 mg/dL (ref >39)
LDL CALC: 92 mg/dL (ref 0–99)
TRIGLYCERIDES: 237 mg/dL — AB (ref <150)
Total CHOL/HDL Ratio: 4.5 Ratio
VLDL: 47 mg/dL — AB (ref 0–40)

## 2013-01-14 LAB — CBC
HCT: 43.3 % (ref 39.0–52.0)
Hemoglobin: 15.3 g/dL (ref 13.0–17.0)
MCH: 27.7 pg (ref 26.0–34.0)
MCHC: 35.3 g/dL (ref 30.0–36.0)
MCV: 78.3 fL (ref 78.0–100.0)
PLATELETS: 235 10*3/uL (ref 150–400)
RBC: 5.53 MIL/uL (ref 4.22–5.81)
RDW: 13.8 % (ref 11.5–15.5)
WBC: 3.8 10*3/uL — ABNORMAL LOW (ref 4.0–10.5)

## 2013-01-14 LAB — COMPLETE METABOLIC PANEL WITH GFR
ALT: 34 U/L (ref 0–53)
AST: 28 U/L (ref 0–37)
Albumin: 4.1 g/dL (ref 3.5–5.2)
Alkaline Phosphatase: 53 U/L (ref 39–117)
BUN: 12 mg/dL (ref 6–23)
CALCIUM: 9.4 mg/dL (ref 8.4–10.5)
CHLORIDE: 104 meq/L (ref 96–112)
CO2: 24 mEq/L (ref 19–32)
CREATININE: 0.96 mg/dL (ref 0.50–1.35)
GFR, Est African American: >89 mL/min
GFR, Est Non African American: >89 mL/min
Glucose, Bld: 89 mg/dL (ref 70–99)
POTASSIUM: 4.1 meq/L (ref 3.5–5.3)
SODIUM: 137 meq/L (ref 135–145)
TOTAL PROTEIN: 7.3 g/dL (ref 6.0–8.3)
Total Bilirubin: 0.7 mg/dL (ref 0.3–1.2)

## 2013-01-14 LAB — TSH: TSH: 1.676 u[IU]/mL (ref 0.350–4.500)

## 2013-01-14 MED ORDER — OLMESARTAN MEDOXOMIL 20 MG PO TABS: ORAL_TABLET | ORAL | Status: DC

## 2013-01-14 NOTE — Progress Notes (Signed)
   Subjective:    Patient ID: Manuel Ayers, male    DOB: 1975-03-29, 38 y.o.   MRN: [id number]  HPI Pt presents to the clinic with occasional nosebleeds and dizziness when laughing. He is concerned that it is is benicar. He has been on benicar for over a year with no complications. Nosebleeds have happened approximately 3 times since November. He is able to stop them without complication. Dizziness has been ongoing for over year but only really after laughing hard. He has gotten so dizzy before that he felt everything go black but did not pass out. Denies starting any new medications, ear pain, fever, sinus pressure, ST, SOB, cough. He does snore at night. Wife is concerned about sleep apena. Denies any exercise or weight loss plans.    Review of Systems     Objective:   Physical Exam  Constitutional: He is oriented to person, place, and time. He appears well-developed and well-nourished.  Obesity.   HENT:  Head: Normocephalic and atraumatic.  Right Ear: External ear normal.  Left Ear: External ear normal.  Mouth/Throat: Oropharynx is clear and moist.  TM's clear bilaterally.   Bilateral turbinates red and swollen with rhinorrhea.   Eyes: Conjunctivae are normal.  Neck: Normal range of motion. Neck supple.  Large neck circumference.  Cardiovascular: Normal rate, regular rhythm and normal heart sounds.   Pulmonary/Chest: Effort normal and breath sounds normal. He has no wheezes.  Lymphadenopathy:    He has no cervical adenopathy.  Neurological: He is alert and oriented to person, place, and time.  Skin: Skin is warm and dry.  Psychiatric: He has a normal mood and affect. His behavior is normal.          Assessment & Plan:  epistaxis I gave pt nasal saline(ayr) to use twice a day. Consider humidifer in room at night. Pt is a snorer and nares could be coming extremely dry due to air movement. Referral placed for sleep apnea. HO given on exactly how to stop bleed if occurs. Consider  afrin during nose bleed to help with stopping bleeding.   Dizziness-  I reassured pt that I do not think dizziness is caused by benicar, although it is listed as side effect. I would like to look into some other things and if still persist could try changing benicar. It could be some inner ear issues but because only with laughing makes that unlikely. Don't want to give antihistamine or steroid nasal spray b/c could make nosebleeds worse.  Orthostatics BP looked great. Will order metabolic panel. Would like to get sleep apnea testing done. suspicious of his airway and maybe making laughing without dizziness more difficult.  Follow up with PCP in 6 weeks or sooner if needed.   Obesity- concerned sleep apena might be causing some underlying issues. Will test. Discussed exercise and weight loss. Pt is active at work but does not seem on board with targeting weight loss.   Lipid and TSH were ordered pt is fasting and has not had labs checked recently.  Spent 30 minutes with patient and greater than 50 percent of visit spent counseling pt regarding medication management and treatment plan.

## 2013-01-14 NOTE — Patient Instructions (Signed)
Start treating dry nose with nasal saline at least once a day. Afrin on hand for acute bleeds. Make sure drinking enough water and staying hydrated.  Sleep apnea will get pt tested.   Nosebleed Nosebleeds can be caused by many conditions including trauma, infections, polyps, foreign bodies, dry mucous membranes or climate, medications and air conditioning. Most nosebleeds occur in the front of the nose. It is because of this location that most nosebleeds can be controlled by pinching the nostrils gently and continuously. Do this for at least 10 to 20 minutes. The reason for this long continuous pressure is that you must hold it long enough for the blood to clot. If during that 10 to 20 minute time period, pressure is released, the process may have to be started again. The nosebleed may stop by itself, quit with pressure, need concentrated heating (cautery) or stop with pressure from packing. HOME CARE INSTRUCTIONS   If your nose was packed, try to maintain the pack inside until your caregiver removes it. If a gauze pack was used and it starts to fall out, gently replace or cut the end off. Do not cut if a balloon catheter was used to pack the nose. Otherwise, do not remove unless instructed.  Avoid blowing your nose for 12 hours after treatment. This could dislodge the pack or clot and start bleeding again.  If the bleeding starts again, sit up and bending forward, gently pinch the front half of your nose continuously for 20 minutes.  If bleeding was caused by dry mucous membranes, cover the inside of your nose every morning with a petroleum or antibiotic ointment. Use your little fingertip as an applicator. Do this as needed during dry weather. This will keep the mucous membranes moist and allow them to heal.  Maintain humidity in your home by using less air conditioning or using a humidifier.  Do not use aspirin or medications which make bleeding more likely. Your caregiver can give you  recommendations on this.  Resume normal activities as able but try to avoid straining, lifting or bending at the waist for several days.  If the nosebleeds become recurrent and the cause is unknown, your caregiver may suggest laboratory tests. SEEK IMMEDIATE MEDICAL CARE IF:   Bleeding recurs and cannot be controlled.  There is unusual bleeding from or bruising on other parts of the body.  You have a fever.  Nosebleeds continue.  There is any worsening of the condition which originally brought you in.  You become lightheaded, feel faint, become sweaty or vomit blood. MAKE SURE YOU:   Understand these instructions.  Will watch your condition.  Will get help right away if you are not doing well or get worse. Document Released: 10/09/2004 Document Revised: 03/24/2011 Document Reviewed: 12/01/2008 [facility] Patient Information 2014 [place], [place].

## 2013-01-18 ENCOUNTER — Telehealth: Payer: Self-pay | Admitting: *Deleted

## 2013-01-18 NOTE — Telephone Encounter (Signed)
Pt's wife calling because she thought that Manuel Ayers was going to order an at home sleep study for him and she was calling to confirm this. I told her that he did not have an order in for this so [facility] and someone will get back to her. She was also given the lab results and stated that he is currently taking Lovaza for his cholesterol and wanted to know if she still wanted him to take another statin in addition to this. Please advise.[name], [name]

## 2013-01-19 ENCOUNTER — Other Ambulatory Visit: Payer: Self-pay | Admitting: Physician Assistant

## 2013-01-19 DIAGNOSIS — E669 Obesity, unspecified: Secondary | ICD-10-CM

## 2013-01-19 DIAGNOSIS — R0683 Snoring: Secondary | ICD-10-CM

## 2013-01-19 MED ORDER — PRAVASTATIN SODIUM 20 MG PO TABS: 20.0000 mg | ORAL_TABLET | Freq: Every day | ORAL | Status: DC

## 2013-01-19 NOTE — Telephone Encounter (Signed)
Sleep study was in note. inadvertently forget to place. Should hear back today. Yes he should be on Lovaza and a statin.

## 2013-01-19 NOTE — Telephone Encounter (Signed)
lvm for pt's wife informing her that Manuel Ayers will place order for sleep study and that her husband should be taking pravastatin along w/Lovaza for his cholesterol.Manuel Ayers, Manuel Ayers

## 2013-02-04 ENCOUNTER — Telehealth: Payer: Self-pay | Admitting: Family Medicine

## 2013-02-04 NOTE — Telephone Encounter (Signed)
Patient's [facility]  She stated that patient's work schedule is conflicting with him being able to do the test at this time.  She said they want to wait and do it later.  I will call [facility] and have Franky MachoLuke come by and pick up the machine/  Patient's wife wanted to let you and Lesly RubensteinJade know since she mentioned that Lesly Rubensteinjade was the one that ordered it.

## 2013-02-06 NOTE — Telephone Encounter (Signed)
FYI: was seen by me for acute visit. Set up for sleep apnea screening but does not feel like he has the time. May want in the future.

## 2013-03-07 ENCOUNTER — Telehealth: Payer: Self-pay | Admitting: *Deleted

## 2013-03-07 NOTE — Telephone Encounter (Signed)
Pt called wanting results of sleep study. I told him that I did not see those results and would need to call someone to find out about this.[name], Jorje Vanatta [place]

## 2013-03-07 NOTE — Telephone Encounter (Signed)
Called and lvm asking for return call regarding pt's sleep study results as it has been 3 weeks since he has had this done.[name], Manuel Ayers [place]

## 2013-03-31 ENCOUNTER — Encounter: Payer: Self-pay | Admitting: Physician Assistant

## 2013-04-11 ENCOUNTER — Other Ambulatory Visit: Payer: Self-pay | Admitting: *Deleted

## 2013-04-11 MED ORDER — OLMESARTAN MEDOXOMIL 20 MG PO TABS: ORAL_TABLET | ORAL | Status: DC

## 2013-11-16 IMAGING — CR DG FOOT COMPLETE 3+V*L*
3 series · 3 of 3 positions shown · non-contrast
Comparison: Plain films 01/10/2011.

CLINICAL DATA: Heel pain for 3 weeks.

LEFT FOOT - COMPLETE 3+ VIEW

[view not recorded (1 of 3)]
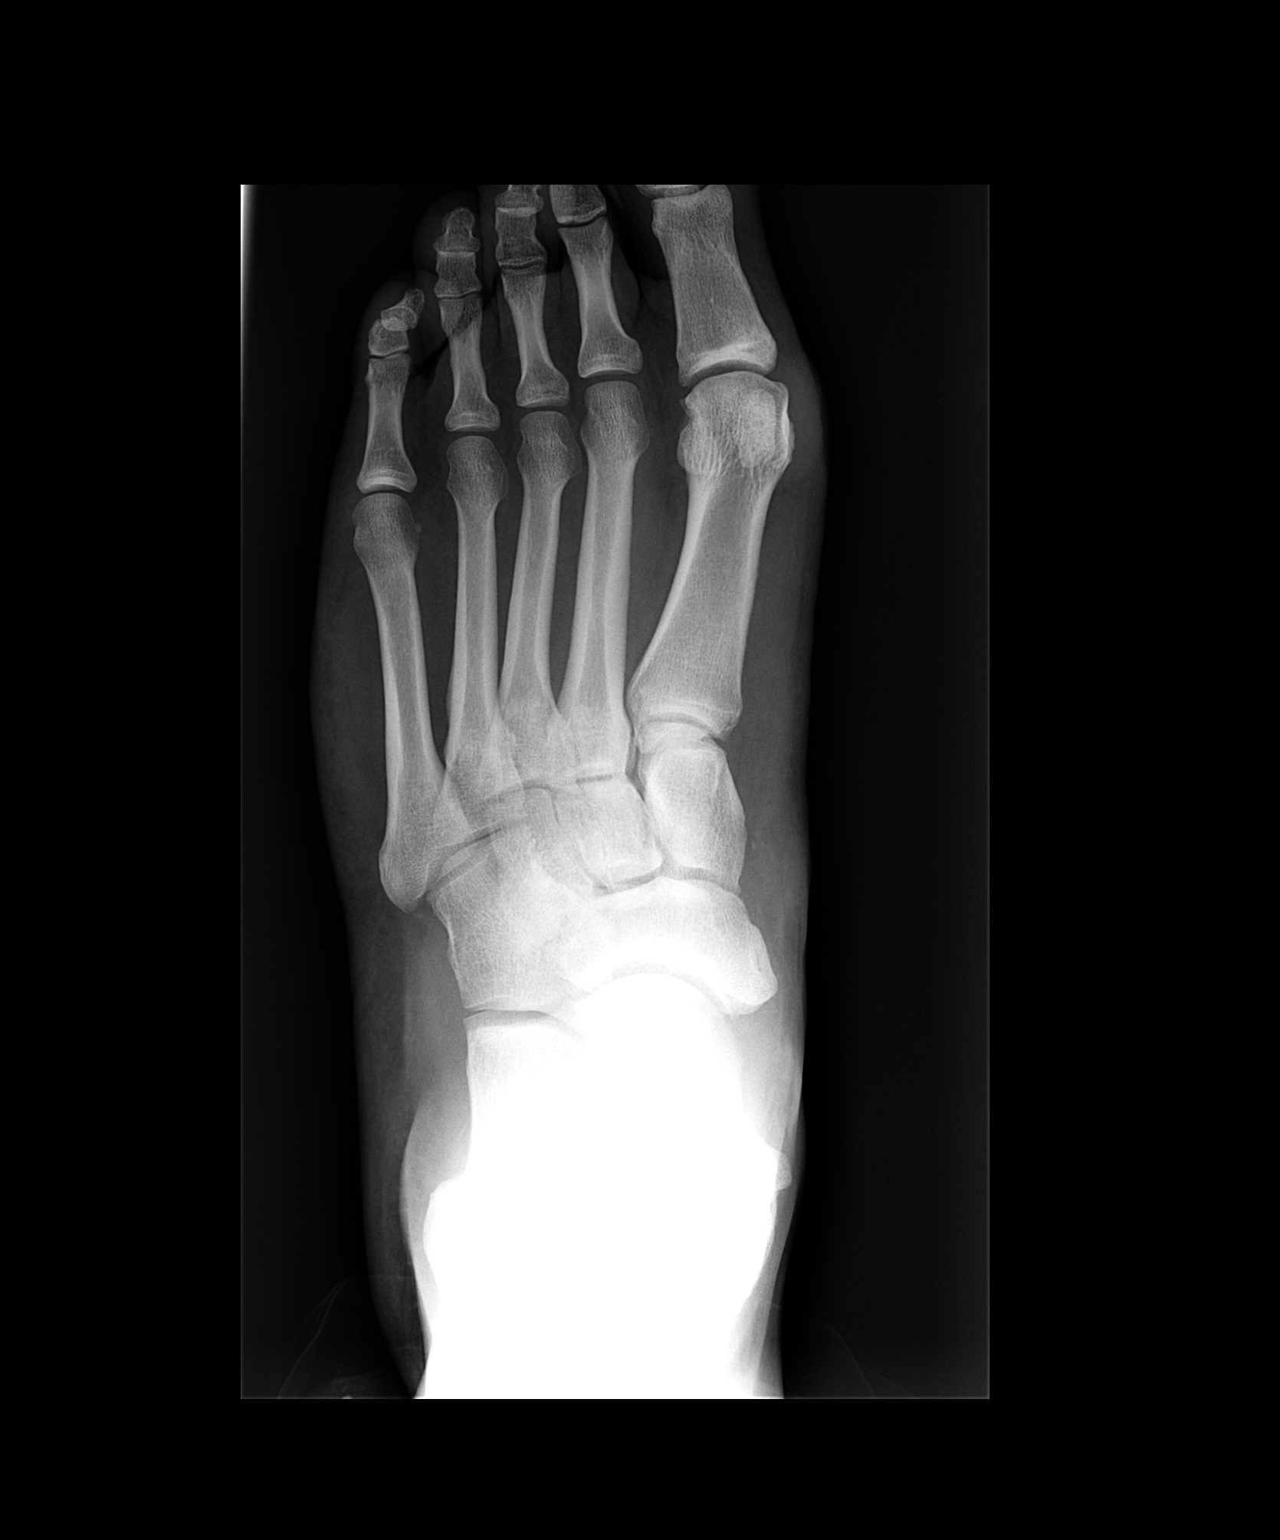

[view not recorded (2 of 3)]
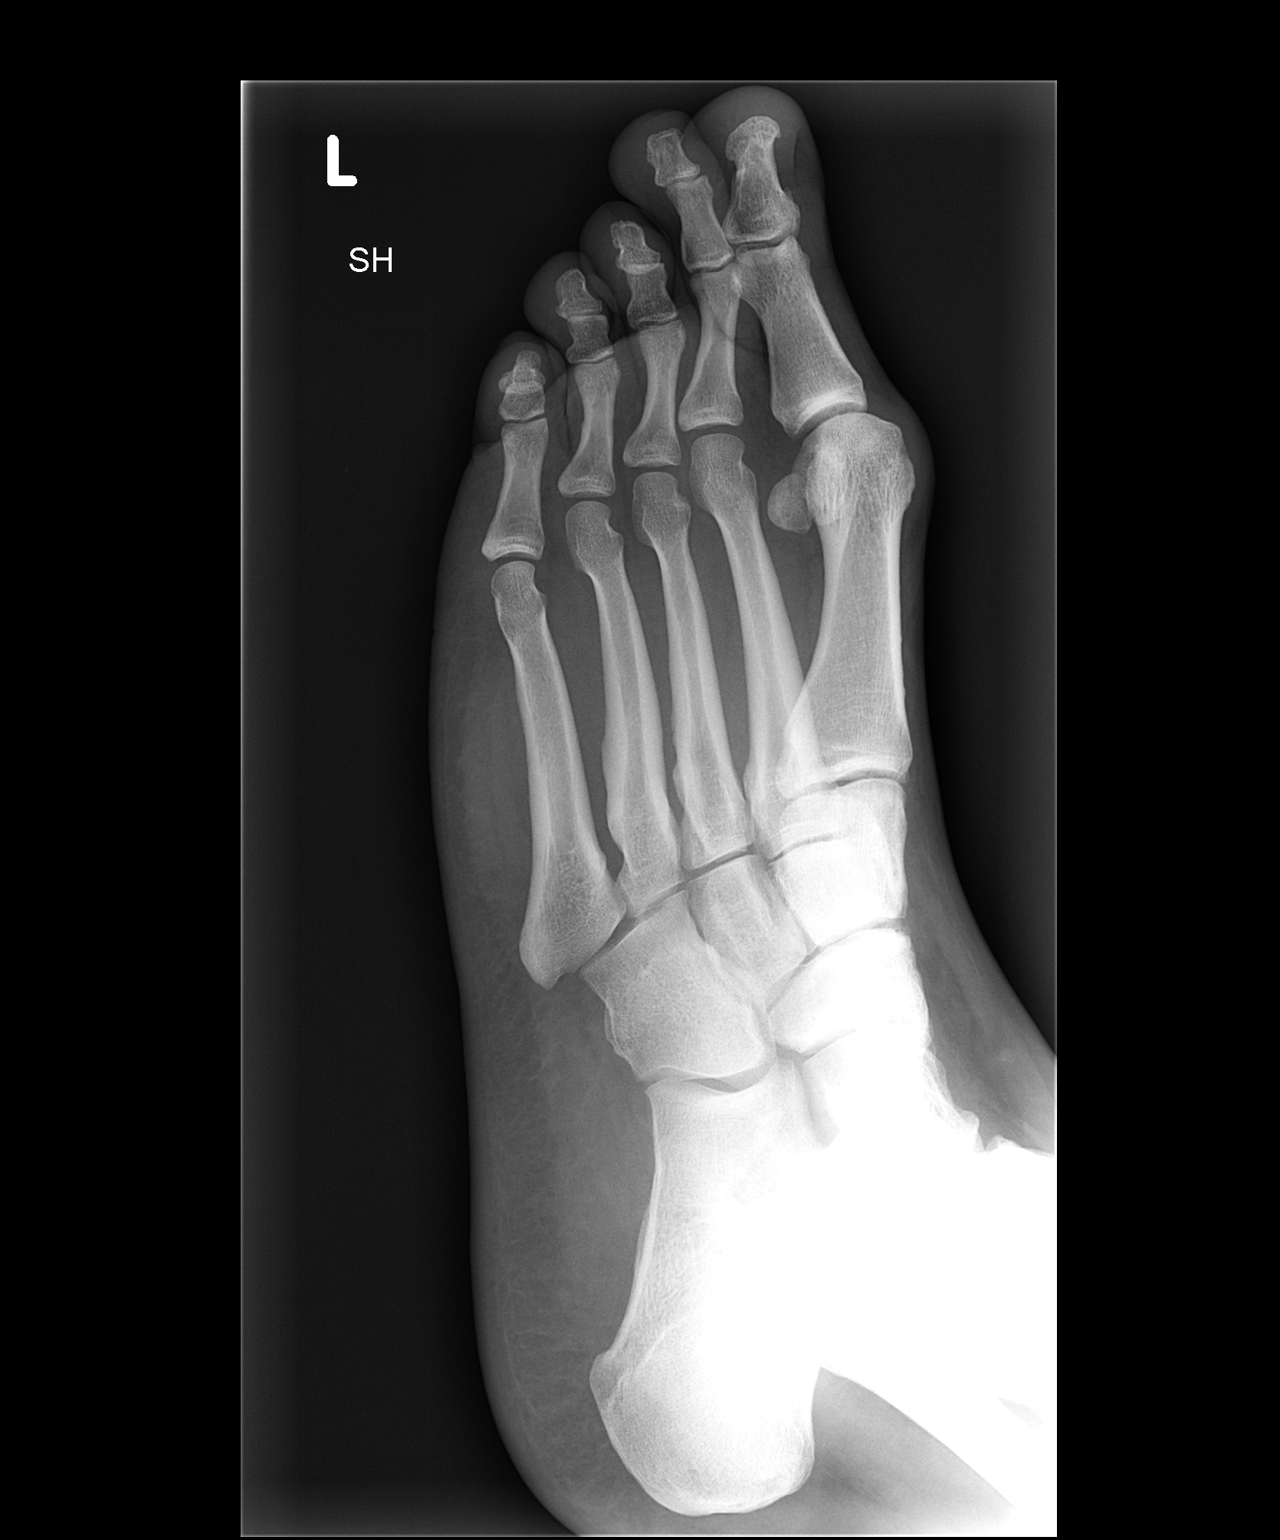

[view not recorded (3 of 3)]
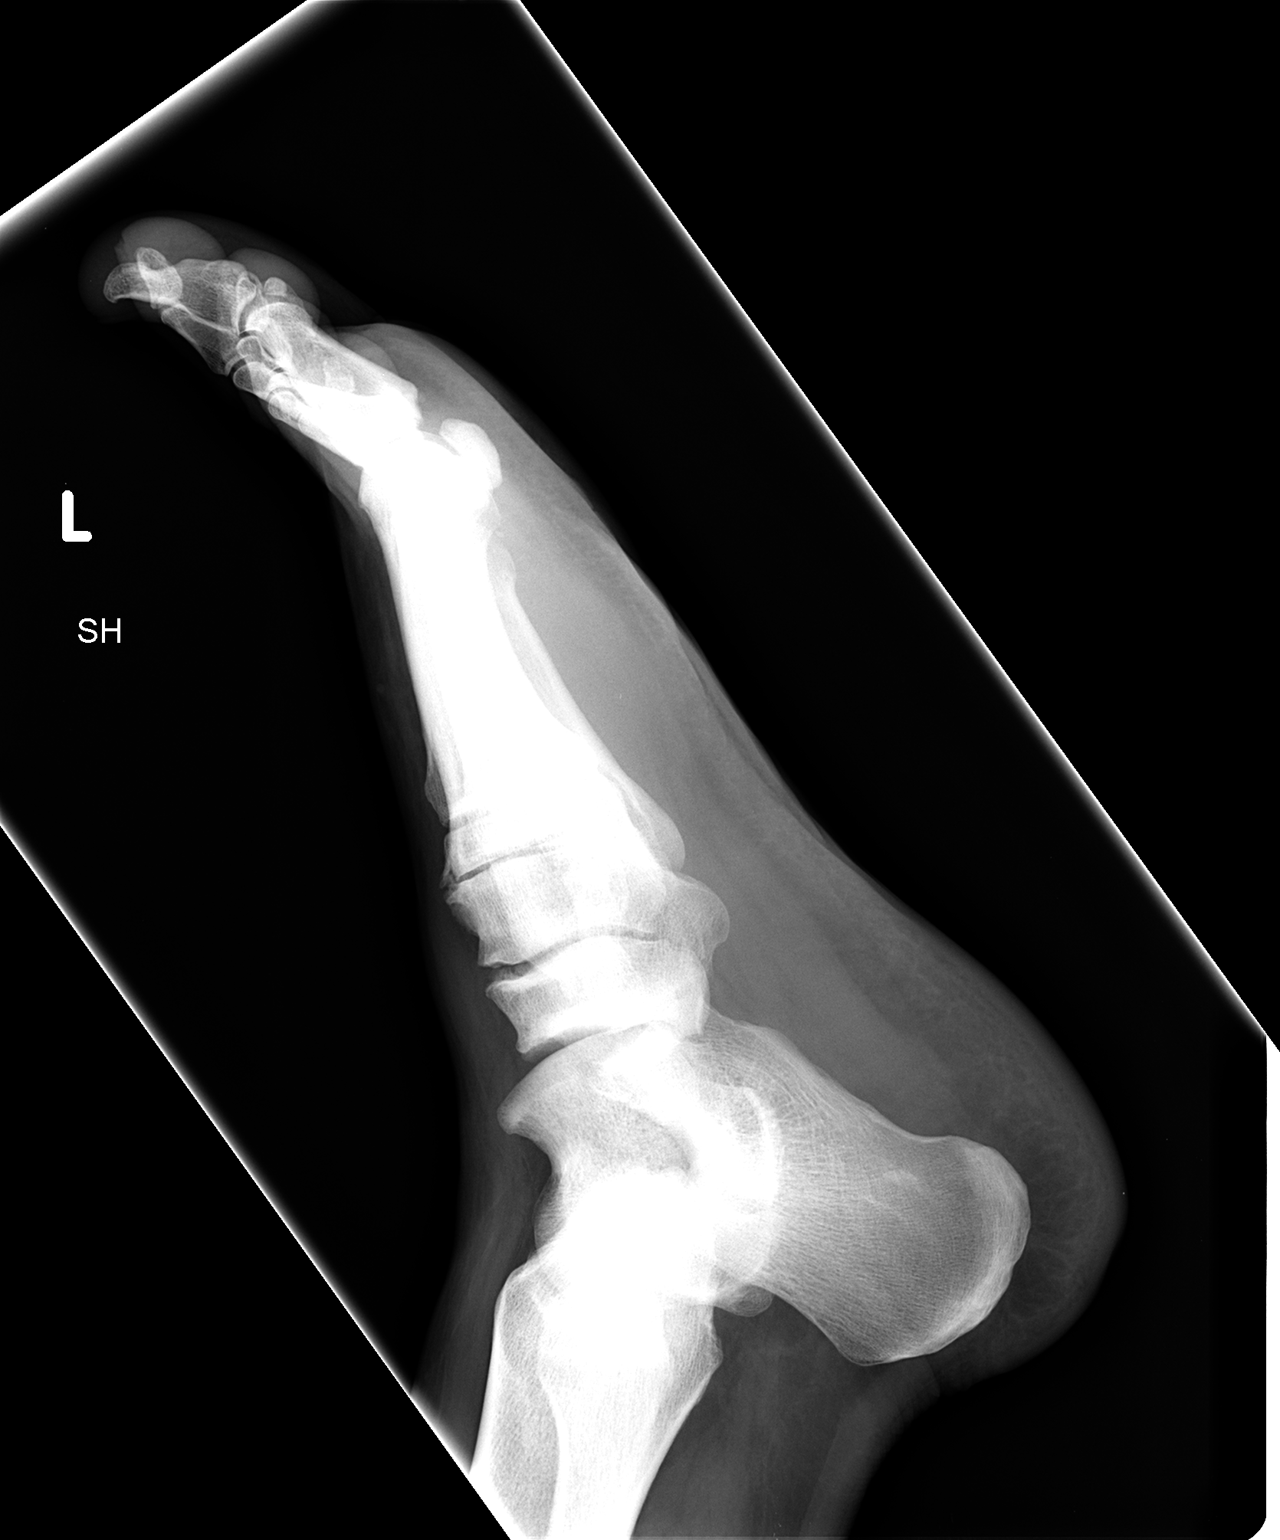

[3 of 3 positions shown; findings below may reference images not displayed]

FINDINGS: The calcaneus is normal in appearance.  Mild midfoot and
first MTP degenerative disease is again seen.  Mild hallux valgus
deformity is also noted.  There is no fracture or dislocation.
Soft tissues are unremarkable.
IMPRESSION: No acute finding.  Stable compared to prior exam.

## 2013-12-28 ENCOUNTER — Ambulatory Visit: Payer: Managed Care, Other (non HMO) | Admitting: Family Medicine

## 2014-01-26 ENCOUNTER — Ambulatory Visit (INDEPENDENT_AMBULATORY_CARE_PROVIDER_SITE_OTHER): Payer: Managed Care, Other (non HMO) | Admitting: Family Medicine

## 2014-01-26 ENCOUNTER — Encounter: Payer: Self-pay | Admitting: Family Medicine

## 2014-01-26 VITALS — BP 126/72 | HR 88 | Ht 72.0 in | Wt 306.0 lb

## 2014-01-26 DIAGNOSIS — I10 Essential (primary) hypertension: Secondary | ICD-10-CM

## 2014-01-26 DIAGNOSIS — Z13220 Encounter for screening for lipoid disorders: Secondary | ICD-10-CM

## 2014-01-26 DIAGNOSIS — G4733 Obstructive sleep apnea (adult) (pediatric): Secondary | ICD-10-CM | POA: Insufficient documentation

## 2014-01-26 DIAGNOSIS — E781 Pure hyperglyceridemia: Secondary | ICD-10-CM

## 2014-01-26 DIAGNOSIS — G4700 Insomnia, unspecified: Secondary | ICD-10-CM

## 2014-01-26 MED ORDER — PRAVASTATIN SODIUM 20 MG PO TABS: 20.0000 mg | ORAL_TABLET | Freq: Every day | ORAL | Status: DC

## 2014-01-26 MED ORDER — TRAZODONE HCL 50 MG PO TABS: 25.0000 mg | ORAL_TABLET | Freq: Every evening | ORAL | Status: DC | PRN

## 2014-01-26 MED ORDER — OMEGA-3-ACID ETHYL ESTERS 1 G PO CAPS: ORAL_CAPSULE | ORAL | Status: DC

## 2014-01-26 MED ORDER — OLMESARTAN MEDOXOMIL 20 MG PO TABS: ORAL_TABLET | ORAL | Status: DC

## 2014-01-26 NOTE — Progress Notes (Signed)
   Subjective:    Patient ID: Manuel Ayers, Manuel Ayers    DOB: Dec 28, 1975, 39 y.o.   MRN: [id number]  HPI Hypertension- Pt denies chest pain, SOB, dizziness, or heart palpitations.  Taking meds as directed w/o problems.  Denies medication side effects.    Hypertriglyceridemia-previously on Lovaza currently taking this medication. Also on pravastatin 20 mg. No myalgias or S.E.   OSA - Using CPAP some.  Only about once a month.  Says just has hard time sleeping with something on his face.  Says really doesn't sleep well. Says has not sleep well for awhile. He has been so used to working late night that he is really struggling. Says usually goes to bed around 8:30-9:30 and then wakes up around 11 PM. Then usually can't fall back asleep until 2AM and then has to get up at 4AM.    Review of Systems     Objective:   Physical Exam  Constitutional: He is oriented to person, place, and time. He appears well-developed and well-nourished.  HENT:  Head: Normocephalic and atraumatic.  Cardiovascular: Normal rate, regular rhythm and normal heart sounds.   Pulmonary/Chest: Effort normal and breath sounds normal.  Neurological: He is alert and oriented to person, place, and time.  Skin: Skin is warm and dry.  Psychiatric: He has a normal mood and affect. His behavior is normal.          Assessment & Plan:  Hypertension-well-controlled on current regimen. Due for CMP and fasting lipid panel. Follow-up in 6 months.  Insomnia - Discussed options.  Will try a sleep medication. Start trazodone. Discussed potential S.E. Call if not doing well. Could consider sonata if not working well or S.E.  I would really like for him to be able to wear his CPAP more consistently.   Obstructive sleep apnea-rarely using his CPAP.Discussed working on increase use.  Very important for his heart to use it regularly.   Hypertriglyceridemia-due to recheck lipid levels. Lab slip provided. Refill sent to the pharmacy for the next 6  months.

## 2014-07-04 ENCOUNTER — Encounter: Payer: Self-pay | Admitting: Family Medicine

## 2014-07-04 ENCOUNTER — Ambulatory Visit (INDEPENDENT_AMBULATORY_CARE_PROVIDER_SITE_OTHER): Payer: Managed Care, Other (non HMO) | Admitting: Family Medicine

## 2014-07-04 VITALS — BP 140/86 | HR 71 | Ht 72.0 in | Wt 289.0 lb

## 2014-07-04 DIAGNOSIS — H8110 Benign paroxysmal vertigo, unspecified ear: Secondary | ICD-10-CM

## 2014-07-04 DIAGNOSIS — K760 Fatty (change of) liver, not elsewhere classified: Secondary | ICD-10-CM | POA: Diagnosis not present

## 2014-07-04 DIAGNOSIS — K625 Hemorrhage of anus and rectum: Secondary | ICD-10-CM | POA: Diagnosis not present

## 2014-07-04 NOTE — Progress Notes (Signed)
Subjective:    Patient ID: Manuel Ayers, male    DOB: 10-14-1975, 39 y.o.   MRN: [id number]  HPI 39 yo male that was hospitalized at [facility] for dizziness.  Says woke up feeling dizzy later.  Says tried to go to work but by then had to go back to the hotel and lay down.  Says felt like the room was spinning.  He then called EMS.  They hydrated him and they did an MRI to rule out stroke or ischemic disease. He feels some better but still feels like he is walking on a boat. No fever. No sick contacts.  Was bitten by mosquitoes.  Using meclizine - but doesn't seem to help.  He did have an MRI of the brain that was normal. A CT of the abdomen and pelvis that was normal as well. He did mention in hospitalization that he had some blood in his stools. He says mostly is a small amount of bright red blood only when he wipes. He admits that he is typically constipated and if he doesn't take something to help him go he will sometimes go a week between bowel movements. He has never noticed a large amount of blood, or blood mixed in with the stool, or old darker looking blood with a bowel movement. He denies any pain. He says often times striking pre-and juice does seem to help.   Records reviewed from [facility].   Review of Systems  BP 140/86 mmHg  Pulse 71  Ht 6' (1.829 m)  Wt 289 lb (131.09 kg)  BMI 39.19 kg/m2    Allergies  Allergen Reactions  . Lisinopril Cough  . Losartan Other (See Comments)    Sexual dysfunction.     History reviewed. No pertinent past medical history.  History reviewed. No pertinent past surgical history.  History   Social History  . Marital Status: Married    Spouse Name: N/A  . Number of Children: N/A  . Years of Education: N/A   Occupational History  . Not on file.   Social History Main Topics  . Smoking status: Former Games developer  . Smokeless tobacco: Not on file  . Alcohol Use: No  . Drug Use: No  . Sexual Activity: Not on file     Comment:  no rerular exercise   Other Topics Concern  . Not on file   Social History Narrative    Family History  Problem Relation Age of Onset  . Cancer Mother   . Heart attack      uncle  . Diabetes Father   . Hypertension Father   . Diabetes Sister   . Hypertension Sister   . Hypertension Brother   . Kidney failure Sister     Outpatient Encounter Prescriptions as of 07/04/2014  Medication Sig  . olmesartan (BENICAR) 20 MG tablet TAKE 1 TABLET ONCE DAILY  . omega-3 acid ethyl esters (LOVAZA) 1 G capsule TAKE 2 CAPSULES 2 TIMES DAILY  . pravastatin (PRAVACHOL) 20 MG tablet Take 1 tablet (20 mg total) by mouth daily.  . [DISCONTINUED] traZODone (DESYREL) 50 MG tablet Take 0.5-3 tablets (25-150 mg total) by mouth at bedtime as needed for sleep.   No facility-administered encounter medications on file as of 07/04/2014.          Objective:   Physical Exam  Constitutional: He is oriented to person, place, and time. He appears well-developed and well-nourished.  HENT:  Head: Normocephalic and atraumatic.  Right Ear: External  ear normal.  Left Ear: External ear normal.  Nose: Nose normal.  Mouth/Throat: Oropharynx is clear and moist.  TMs and canals are clear. Absent light reflex bilaterally  Eyes: Conjunctivae and EOM are normal. Pupils are equal, round, and reactive to light.  Neck: Neck supple. No thyromegaly present.  Cardiovascular: Normal rate and normal heart sounds.   Pulmonary/Chest: Effort normal and breath sounds normal.  Musculoskeletal:  Mild nystagmus to the right with Dix-Hallpike maneuver.  Lymphadenopathy:    He has no cervical adenopathy.  Neurological: He is alert and oriented to person, place, and time.  Skin: Skin is warm and dry.  Psychiatric: He has a normal mood and affect.          Assessment & Plan:  Benign positional vertigo-based on his history and workup and exam today his symptoms are most consistent with BPV. Discussed diagnosis with him.  Given information about how to do at home exercises. His wife has already printed them off and has some information on how to do the Epley maneuver. Discussed that this does gradually get better on its own but it can take days to weeks to completely resolve. He's already feeling a little bit better this week. Encouraged him to make sure that symptoms have completely resolved before he returns back to work since he does use heavy equipment. His wife also brought forms to be completed for FMLA for her since she missed a time at work to drive to [place] where he was hospitalized.  Rectal bleeding-based on his description this sounds more like rectal tear and strain from hard bowel movements and constipation. We discussed strategies to get his stools soft and to get his bowels moving more regularly. Certainly if he notices a large amount of blood, or darker blood or has abdominal pain that he would need a colonoscopy early. No family history of colon cancer etc.  He was also noted to have fatty liver on the CT of the abdomen. Discussed the importance of diet and exercise to reduce the fat in the liver. We did discuss that the long-term effects can cause hepatitis and inflammation the liver which can lead to scarring and cirrhosis. Encouraged him to work on weight loss and healthy diet and exercise. Recent liver enzymes were normal.

## 2014-11-03 ENCOUNTER — Other Ambulatory Visit: Payer: Self-pay

## 2014-11-03 ENCOUNTER — Other Ambulatory Visit: Payer: Self-pay | Admitting: Family Medicine

## 2015-01-16 ENCOUNTER — Ambulatory Visit: Payer: Managed Care, Other (non HMO) | Admitting: Family Medicine

## 2015-08-03 ENCOUNTER — Encounter: Payer: Self-pay | Admitting: Family Medicine

## 2015-08-03 ENCOUNTER — Ambulatory Visit (INDEPENDENT_AMBULATORY_CARE_PROVIDER_SITE_OTHER): Payer: Managed Care, Other (non HMO) | Admitting: Family Medicine

## 2015-08-03 ENCOUNTER — Ambulatory Visit (HOSPITAL_BASED_OUTPATIENT_CLINIC_OR_DEPARTMENT_OTHER)
Admission: RE | Admit: 2015-08-03 | Discharge: 2015-08-03 | Disposition: A | Discharge status: Home / Self Care | Payer: Managed Care, Other (non HMO) | Source: Ambulatory Visit | Attending: Family Medicine | Admitting: Family Medicine

## 2015-08-03 VITALS — BP 130/77 | HR 75 | Wt 305.0 lb

## 2015-08-03 DIAGNOSIS — M545 Low back pain, unspecified: Secondary | ICD-10-CM

## 2015-08-03 DIAGNOSIS — M722 Plantar fascial fibromatosis: Secondary | ICD-10-CM

## 2015-08-03 DIAGNOSIS — R1031 Right lower quadrant pain: Secondary | ICD-10-CM

## 2015-08-03 DIAGNOSIS — M5136 Other intervertebral disc degeneration, lumbar region: Secondary | ICD-10-CM | POA: Insufficient documentation

## 2015-08-03 DIAGNOSIS — R21 Rash and other nonspecific skin eruption: Secondary | ICD-10-CM | POA: Diagnosis not present

## 2015-08-03 DIAGNOSIS — R590 Localized enlarged lymph nodes: Secondary | ICD-10-CM | POA: Insufficient documentation

## 2015-08-03 LAB — BASIC METABOLIC PANEL WITH GFR
BUN: 14 mg/dL (ref 7–25)
CHLORIDE: 105 mmol/L (ref 98–110)
CO2: 22 mmol/L (ref 20–31)
CREATININE: 0.99 mg/dL (ref 0.60–1.35)
Calcium: 8.9 mg/dL (ref 8.6–10.3)
GFR, Est African American: >89 mL/min (ref >=60)
GLUCOSE: 125 mg/dL — AB (ref 65–99)
Potassium: 4.3 mmol/L (ref 3.5–5.3)
SODIUM: 138 mmol/L (ref 135–146)

## 2015-08-03 LAB — POCT URINALYSIS DIPSTICK
Bilirubin, UA: NEGATIVE
Blood, UA: NEGATIVE
GLUCOSE UA: NEGATIVE
Ketones, UA: NEGATIVE
Leukocytes, UA: NEGATIVE
NITRITE UA: NEGATIVE
Protein, UA: NEGATIVE
Spec Grav, UA: 1.02
pH, UA: 7

## 2015-08-03 MED ORDER — IOPAMIDOL (ISOVUE-300) INJECTION 61%
100.0000 mL | Freq: Once | INTRAVENOUS | Status: AC | PRN
  Administered 2015-08-03: 100 mL via INTRAVENOUS

## 2015-08-03 MED ORDER — TERBINAFINE HCL 1 % EX CREA: 1.0000 "application " | TOPICAL_CREAM | Freq: Every day | CUTANEOUS | Status: DC

## 2015-08-03 NOTE — Progress Notes (Addendum)
Subjective:    CC:   HPI:  Low Back pain - Patient says he started noticing some right-sided low back pain. It would occasionally radiate around to the left side as well. He does do a lot of heavy lifting and pulling and wishing at work. He doesn't remember a specific injury but he is very physically active at work. He feels like it's been getting progressively worse. He is even had a few nights where it actually woke him up out of his sleep. His wife had a surgery recently so he even took a couple of her hydrocodone for pain. He has tried ibuprofen and Aleve without relief. Then last week he felt a sudden sharp pain in the right side. He felt like it was someone stabbing him. He said it only lasted for a few seconds and then happened again about 15 minutes later. He finally went to sit down and says it didn't happen again the rest of the day. Then about 2 days ago he had a similar sharp pain which she describes as feeling more like a pencil poking him and set of a knife. But almost an exact same location. He has not had any nausea with it. No fevers or chills. No blood in the stool. But he has been more gassy than usual.  He is also had some pain in his left foot on the base of the foot near his heel. He says it feels like a sharp stabbing pain. It happens almost at least once a year and last a few weeks and then resolves. He says it's worse when he first puts his weight on the foot in the morning and then if he gets going it actually tends to get better.  Is also had a rash on his left lower leg and his right groin area. He was seen and given a perception for mometasone which she says seems to have cleared up the rash on his leg but hasn't really treated the groin rash. He says it's extremely itchy and he's been wearing gloves at night to avoid scratching it.  Past medical history, Surgical history, Family history not pertinant except as noted below, Social history, Allergies, and medications have been  entered into the medical record, reviewed, and corrections made.   Review of Systems: No fevers, chills, night sweats, weight loss, chest pain, or shortness of breath.   Objective:    General: Well Developed, well nourished, and in no acute distress.  Neuro: Alert and oriented x3, extra-ocular muscles intact, sensation grossly intact.  HEENT: Normocephalic, atraumatic  Skin: Warm and dry. On his abdomen he has a significant significant area crossing over both groin creases and up over onto the lower abdomen of hyperpigmented dry scaly skin is well demarcated. No open wounds or crusting or drainage. Cardiac: Regular rate and rhythm, no murmurs rubs or gallops, no lower extremity edema.  Respiratory: Clear to auscultation bilaterally. Not using accessory muscles, speaking in full sentences. Back: Nontender over the lumbar spine. Nontender over the paraspinous muscles. Patellar reflexes 2+. Feet: Left foot exam is normal. No swelling. Ankle with normal range of motion. Nontender over the base of the heel or into the arch. No pain or discomfort with dorsiflexion. Abd: Soft, tender in the right upper quadrant and right lower quadrant. Some guarding in the suprapubic area. I do feel like I feel distention of the liver in the right upper quadrant. He did have some right CVA tenderness as well. No rebound.  Impression and Recommendations:   Low back pain -  Certainly could just be musculoskeletal strain. He does a lot of heavy lifting pushing and pulling at work. I am concerned that the pain was severe enough that it woke him up at night. With the addition of the onset of the right-sided pain and right lower abdominal pain I think a CT is warranted at this point. If negative then will work on possible physical therapy and her home stretches and exercises since he travels a lot for work.  Right sided abdominal pain-see note above. Consider appendicitis versus diverticulitis by sit versus acute hepatitis.  Did get a stat BMP for renal function before the CT. If negative consider adding a CBC.  Plantar fascitis, left foot - based on his description and normal exam of his foot today and suspect he has planter fasciitis. Given a handout on exercises and stretches to do on his own. Recommend icing it and taking an anti-inflammatory as needed.  Rash - Skin scraping performed. Most likely fungal. Will send over new prescription to his pharmacy for scription sent for terbinafine. Will call with KOH results.    Liver edge is distended on exam-we'll get CT for further evaluation.

## 2015-08-05 NOTE — Progress Notes (Signed)
All labs are normal. 

## 2015-08-06 ENCOUNTER — Telehealth: Payer: Self-pay | Admitting: Family Medicine

## 2015-08-06 ENCOUNTER — Other Ambulatory Visit: Payer: Self-pay

## 2015-08-06 LAB — FUNGAL STAIN

## 2015-08-06 MED ORDER — TERBINAFINE HCL 1 % EX CREA: 1.0000 "application " | TOPICAL_CREAM | Freq: Every day | CUTANEOUS | 1 refills | Status: DC

## 2015-08-06 NOTE — Telephone Encounter (Signed)
Patients wife walked in and request to know if Dr. Linford Arnold called in anything for the pain and if not need something sent today if possible to pharmacy [facility] in [place].

## 2015-08-08 MED ORDER — MELOXICAM 15 MG PO TABS: 15.0000 mg | ORAL_TABLET | Freq: Every day | ORAL | 1 refills | Status: DC

## 2015-08-08 MED ORDER — CLOTRIMAZOLE-BETAMETHASONE 1-0.05 % EX CREA: 1.0000 "application " | TOPICAL_CREAM | Freq: Two times a day (BID) | CUTANEOUS | 0 refills | Status: DC

## 2015-08-08 NOTE — Addendum Note (Signed)
Addended by: Nani Gasser D on: 08/08/2015 11:31 AM   Modules accepted: Orders

## 2015-08-13 ENCOUNTER — Ambulatory Visit (INDEPENDENT_AMBULATORY_CARE_PROVIDER_SITE_OTHER): Payer: Managed Care, Other (non HMO) | Admitting: Family Medicine

## 2015-08-13 ENCOUNTER — Encounter: Payer: Self-pay | Admitting: Family Medicine

## 2015-08-13 DIAGNOSIS — S338XXA Sprain of other parts of lumbar spine and pelvis, initial encounter: Secondary | ICD-10-CM

## 2015-08-13 DIAGNOSIS — S39012A Strain of muscle, fascia and tendon of lower back, initial encounter: Secondary | ICD-10-CM | POA: Insufficient documentation

## 2015-08-13 MED ORDER — CYCLOBENZAPRINE HCL 5 MG PO TABS: 5.0000 mg | ORAL_TABLET | Freq: Three times a day (TID) | ORAL | 1 refills | Status: DC | PRN

## 2015-08-13 NOTE — Progress Notes (Signed)
   Subjective:    I'm seeing this patient as a consultation for:  Dr. Linford Arnold  CC: Lower back pain  HPI:  Manuel Ayers is a 40 y.o. male who presents to [facility] today for worsening lower back pain.  He reports the right side is worse than the left.  Patient noticed the pain 6 months ago.  He denies known injury or inciting factors.  He describes the pain as a constant, non-radiating, throbbing sensation with associated tightness.  He admits to doing lots of moving and lifting for his job but can't think of any specific injury.  He denies lower extremity numbness, tingling, and weakness.  Patient reports his pain is worse in the morning upon awakening.  He claims it gets better throughout the day but says it increases with movement at work.  He can't specifically name a movement that worsens his pain. Denies fever, chills, and weight loss.  He has taken meloxicam and used a heating pad without relief.  Patient states hydrocodone has relieved his pain in the past.  He has not tried physical therapy because he travels a lot for his job.   Patient was seen by Dr. Linford Arnold last week and had a CT done which was significant for mild degenerative changes and bilateral foramen narrowing at L5-S1.    Past medical history, Surgical history, Family history not pertinant except as noted below, Social history, Allergies, and medications have been entered into the medical record, reviewed, and no changes needed.   Review of Systems: No headache, visual changes, nausea, vomiting, diarrhea, constipation, dizziness, abdominal pain, skin rash, fevers, chills, night sweats, weight loss, swollen lymph nodes, body aches, joint swelling, chest pain, shortness of breath, mood changes, visual or auditory hallucinations.   Objective:    Vitals:   08/13/15 1010  BP: 131/86  Pulse: 77   General: Well Developed, well nourished, and in no acute distress. Obese Neuro/Psych: Alert and  oriented x3, extra-ocular muscles intact, able to move all 4 extremities, sensation grossly intact. Skin: Warm and dry, no rashes noted.  Respiratory: Not using accessory muscles, speaking in full sentences, trachea midline.  Cardiovascular: Pulses palpable, no extremity edema. Abdomen: Does not appear distended. MSK: Back: full range of motion in flexion, extension, rotation, and lateral flexion No obvious deformities LE strength 5/5 bilaterally Sensation intact to light touch Patellar reflexes 2+ bilaterally.   Tender over the right lumbar musculature.  Non-tender over the midline spine. Negative straight leg raise bilaterally  CT scan abdomen and pelvis reviewed.   No results found for this or any previous visit (from the past 24 hour(s)). No results found.  Impression and Recommendations:    Assessment and Plan: 40 y.o. male with 40 y.o. male with a 6 month history of worsening right sided lumbar pain, likely a lumbosacral strain.  Arthritic changes could certainly be contributing to the pain as well.  No signs or symptoms of nerve impingement warranting MRI at this time. - Physical therapy prescription  - Flexeril at night - TENS unit - Follow up in a month or two   Discussed warning signs or symptoms. Please see discharge instructions. Patient expresses understanding.

## 2015-08-13 NOTE — Patient Instructions (Signed)
Thank you for coming in today. Attend physical therapy. You can take up to 2 Aleve twice daily as needed for pain. Take Flexeril at bedtime as needed for pain. Return in a few months.  Come back or go to the emergency room if you notice new weakness new numbness problems walking or bowel or bladder problems.   Lumbosacral Strain Lumbosacral strain is a strain of any of the parts that make up your lumbosacral vertebrae. Your lumbosacral vertebrae are the bones that make up the lower third of your backbone. Your lumbosacral vertebrae are held together by muscles and tough, fibrous tissue (ligaments).  CAUSES  A sudden blow to your back can cause lumbosacral strain. Also, anything that causes an excessive stretch of the muscles in the low back can cause this strain. This is typically seen when people exert themselves strenuously, fall, lift heavy objects, bend, or crouch repeatedly. RISK FACTORS  Physically demanding work.  Participation in pushing or pulling sports or sports that require a sudden twist of the back (tennis, golf, baseball).  Weight lifting.  Excessive lower back curvature.  Forward-tilted pelvis.  Weak back or abdominal muscles or both.  Tight hamstrings. SIGNS AND SYMPTOMS  Lumbosacral strain may cause pain in the area of your injury or pain that moves (radiates) down your leg.  DIAGNOSIS Your health care provider can often diagnose lumbosacral strain through a physical exam. In some cases, you may need tests such as X-ray exams.  TREATMENT  Treatment for your lower back injury depends on many factors that your clinician will have to evaluate. However, most treatment will include the use of anti-inflammatory medicines. HOME CARE INSTRUCTIONS   Avoid hard physical activities (tennis, racquetball, waterskiing) if you are not in proper physical condition for it. This may aggravate or create problems.  If you have a back problem, avoid sports requiring sudden body  movements. Swimming and walking are generally safer activities.  Maintain good posture.  Maintain a healthy weight.  For acute conditions, you may put ice on the injured area.  Put ice in a plastic bag.  Place a towel between your skin and the bag.  Leave the ice on for 20 minutes, 2-3 times a day.  When the low back starts healing, stretching and strengthening exercises may be recommended. SEEK MEDICAL CARE IF:  Your back pain is getting worse.  You experience severe back pain not relieved with medicines. SEEK IMMEDIATE MEDICAL CARE IF:   You have numbness, tingling, weakness, or problems with the use of your arms or legs.  There is a change in bowel or bladder control.  You have increasing pain in any area of the body, including your belly (abdomen).  You notice shortness of breath, dizziness, or feel faint.  You feel sick to your stomach (nauseous), are throwing up (vomiting), or become sweaty.  You notice discoloration of your toes or legs, or your feet get very cold. MAKE SURE YOU:   Understand these instructions.  Will watch your condition.  Will get help right away if you are not doing well or get worse.   This information is not intended to replace advice given to you by your health care provider. Make sure you discuss any questions you have with your health care provider.   Document Released: 10/09/2004 Document Revised: 01/20/2014 Document Reviewed: 08/18/2012 Elsevier Interactive Patient Education [employer]

## 2015-08-23 ENCOUNTER — Other Ambulatory Visit: Payer: Self-pay | Admitting: Family Medicine

## 2015-08-31 ENCOUNTER — Other Ambulatory Visit: Payer: Self-pay | Admitting: Family Medicine

## 2015-11-22 ENCOUNTER — Telehealth: Payer: Self-pay | Admitting: *Deleted

## 2015-11-22 MED ORDER — PRAVASTATIN SODIUM 20 MG PO TABS
20.0000 mg | ORAL_TABLET | Freq: Every day | ORAL | 0 refills | Status: DC
Start: 2015-11-22 — End: 2016-05-07

## 2015-11-22 MED ORDER — OLMESARTAN MEDOXOMIL 20 MG PO TABS: 20.0000 mg | ORAL_TABLET | Freq: Every day | ORAL | 0 refills | Status: DC

## 2015-11-22 NOTE — Telephone Encounter (Signed)
Pt lvm and reports that his employer is sending him to  [place] to work and will need a 90 day supply of his meds.

## 2015-11-22 NOTE — Telephone Encounter (Signed)
Called pt and informed him that his medications will be sent to his pharmacy.[name], [place]

## 2016-05-07 ENCOUNTER — Other Ambulatory Visit: Payer: Self-pay

## 2016-05-07 MED ORDER — PRAVASTATIN SODIUM 20 MG PO TABS: 20.0000 mg | ORAL_TABLET | Freq: Every day | ORAL | 0 refills | Status: DC

## 2016-05-07 MED ORDER — OLMESARTAN MEDOXOMIL 20 MG PO TABS: 20.0000 mg | ORAL_TABLET | Freq: Every day | ORAL | 0 refills | Status: DC

## 2016-07-09 ENCOUNTER — Other Ambulatory Visit: Payer: Self-pay | Admitting: *Deleted

## 2016-07-09 ENCOUNTER — Telehealth: Payer: Self-pay | Admitting: Family Medicine

## 2016-07-09 MED ORDER — PRAVASTATIN SODIUM 20 MG PO TABS: 20.0000 mg | ORAL_TABLET | Freq: Every day | ORAL | 0 refills | Status: DC

## 2016-07-09 MED ORDER — OLMESARTAN MEDOXOMIL 20 MG PO TABS: 20.0000 mg | ORAL_TABLET | Freq: Every day | ORAL | 0 refills | Status: DC

## 2016-07-09 NOTE — Telephone Encounter (Signed)
Refills sent

## 2016-07-09 NOTE — Telephone Encounter (Signed)
Patient called scheduled an appointment for 07/22/16 because he works out of town he has 2 pills left for bp meds and needs benicar and pravastatin refilled and sent to the [facility] as soon as possible. Thanks

## 2016-07-22 ENCOUNTER — Encounter: Payer: Self-pay | Admitting: Family Medicine

## 2016-07-22 ENCOUNTER — Ambulatory Visit (INDEPENDENT_AMBULATORY_CARE_PROVIDER_SITE_OTHER): Payer: Self-pay | Admitting: Family Medicine

## 2016-07-22 VITALS — BP 130/86 | HR 67 | Ht 72.0 in | Wt 317.0 lb

## 2016-07-22 DIAGNOSIS — I10 Essential (primary) hypertension: Secondary | ICD-10-CM

## 2016-07-22 DIAGNOSIS — E781 Pure hyperglyceridemia: Secondary | ICD-10-CM

## 2016-07-22 DIAGNOSIS — S83281A Other tear of lateral meniscus, current injury, right knee, initial encounter: Secondary | ICD-10-CM

## 2016-07-22 LAB — CBC
HCT: 44.5 % (ref 38.5–50.0)
HEMOGLOBIN: 15 g/dL (ref 13.2–17.1)
MCH: 27 pg (ref 27.0–33.0)
MCHC: 33.7 g/dL (ref 32.0–36.0)
MCV: 80 fL (ref 80.0–100.0)
MPV: 10.1 fL (ref 7.5–12.5)
Platelets: 238 10*3/uL (ref 140–400)
RBC: 5.56 MIL/uL (ref 4.20–5.80)
RDW: 13.7 % (ref 11.0–15.0)
WBC: 3.9 10*3/uL (ref 3.8–10.8)

## 2016-07-22 MED ORDER — MELOXICAM 15 MG PO TABS: 15.0000 mg | ORAL_TABLET | Freq: Every day | ORAL | 1 refills | Status: AC

## 2016-07-22 MED ORDER — OLMESARTAN MEDOXOMIL 20 MG PO TABS: 20.0000 mg | ORAL_TABLET | Freq: Every day | ORAL | 3 refills | Status: DC

## 2016-07-22 MED ORDER — CYCLOBENZAPRINE HCL 5 MG PO TABS: 5.0000 mg | ORAL_TABLET | Freq: Two times a day (BID) | ORAL | 1 refills | Status: AC | PRN

## 2016-07-22 MED ORDER — PRAVASTATIN SODIUM 20 MG PO TABS: 20.0000 mg | ORAL_TABLET | Freq: Every day | ORAL | 3 refills | Status: DC

## 2016-07-22 NOTE — Progress Notes (Signed)
Subjective:    CC: HTN  HPI:  Hypertension- Pt denies chest pain, SOB, dizziness, or heart palpitations.  Taking meds as directed w/o problems.  Denies medication side effects.    Hyperlipidemia-currently on pravastatin 20 mg daily. Tolerating well without any side effects.   Did want to ask me about his right knee as well. He actually had an injury at work where he is knee hit a steel beam from what I understand. He did see Worker's Comp. After 3 months of not getting significant relief of his pain and discomfort they actually did do an MRI. The MRI showed diffuse tearing of the lateral meniscus and a tear of the posterior meniscus. They did do a cortisone injection in the knee and has been getting some relief from that. He just wanted me to look at the report and give him any thoughts.  Past medical history, Surgical history, Family history not pertinant except as noted below, Social history, Allergies, and medications have been entered into the medical record, reviewed, and corrections made.   Review of Systems: No fevers, chills, night sweats, weight loss, chest pain, or shortness of breath.   Objective:    General: Well Developed, well nourished, and in no acute distress.  Neuro: Alert and oriented x3, extra-ocular muscles intact, sensation grossly intact.  HEENT: Normocephalic, atraumatic  Skin: Warm and dry, no rashes. Cardiac: Regular rate and rhythm, no murmurs rubs or gallops, no lower extremity edema.  Respiratory: Clear to auscultation bilaterally. Not using accessory muscles, speaking in full sentences.   Impression and Recommendations:   HTN - Well controlled. Continue current regimen. Follow up in  6months.   Hyperlipidemia-due to recheck lipid panel.Due for CMP and lipid panel.  Right knee meniscal tear-discussed that typically the initial treatment is often a steroid injection and some physical therapy. He says he was given some stretches to work on home. I explained  that these often will partially heal. It may not go completely back to normal but there can be some partial healing or scarring down of the lesion. If pain is persistent or if he starts noticing any locking in the knee then recommend he consider going back to see the [employer] physician and/or getting a specialist consult.

## 2016-07-23 LAB — COMPLETE METABOLIC PANEL WITH GFR
ALBUMIN: 3.9 g/dL (ref 3.6–5.1)
ALK PHOS: 53 U/L (ref 40–115)
ALT: 31 U/L (ref 9–46)
AST: 28 U/L (ref 10–40)
BILIRUBIN TOTAL: 0.6 mg/dL (ref 0.2–1.2)
BUN: 10 mg/dL (ref 7–25)
CALCIUM: 9.6 mg/dL (ref 8.6–10.3)
CO2: 20 mmol/L (ref 20–31)
CREATININE: 0.99 mg/dL (ref 0.60–1.35)
Chloride: 102 mmol/L (ref 98–110)
GFR, Est African American: >89 mL/min (ref >=60)
GFR, Est Non African American: >89 mL/min (ref >=60)
GLUCOSE: 105 mg/dL — AB (ref 65–99)
POTASSIUM: 4.3 mmol/L (ref 3.5–5.3)
Sodium: 136 mmol/L (ref 135–146)
TOTAL PROTEIN: 7.3 g/dL (ref 6.1–8.1)

## 2016-07-23 LAB — LIPID PANEL
CHOLESTEROL: 182 mg/dL (ref <200)
HDL: 45 mg/dL (ref >40)
LDL Cholesterol: 96 mg/dL (ref <100)
Total CHOL/HDL Ratio: 4 Ratio (ref <5.0)
Triglycerides: 203 mg/dL — ABNORMAL HIGH (ref <150)
VLDL: 41 mg/dL — ABNORMAL HIGH (ref <30)

## 2016-07-23 LAB — TSH: TSH: 1.42 m[IU]/L (ref 0.40–4.50)

## 2016-09-16 ENCOUNTER — Ambulatory Visit (INDEPENDENT_AMBULATORY_CARE_PROVIDER_SITE_OTHER): Payer: Self-pay | Admitting: Family Medicine

## 2016-09-16 ENCOUNTER — Encounter: Payer: Self-pay | Admitting: Family Medicine

## 2016-09-16 VITALS — BP 118/70 | HR 88 | Wt 291.0 lb

## 2016-09-16 DIAGNOSIS — E1165 Type 2 diabetes mellitus with hyperglycemia: Secondary | ICD-10-CM

## 2016-09-16 DIAGNOSIS — IMO0001 Reserved for inherently not codable concepts without codable children: Secondary | ICD-10-CM

## 2016-09-16 LAB — POCT GLYCOSYLATED HEMOGLOBIN (HGB A1C): Hemoglobin A1C: 14

## 2016-09-16 LAB — HM DIABETES EYE EXAM

## 2016-09-16 MED ORDER — INSULIN GLARGINE 300 UNIT/ML ~~LOC~~ SOPN: 15.0000 [IU] | PEN_INJECTOR | Freq: Every day | SUBCUTANEOUS | 1 refills | Status: DC

## 2016-09-16 MED ORDER — BLOOD GLUCOSE MONITOR KIT: PACK | 0 refills | Status: AC

## 2016-09-16 MED ORDER — INSULIN GLARGINE 300 UNIT/ML ~~LOC~~ SOPN: 15.0000 [IU] | PEN_INJECTOR | Freq: Every day | SUBCUTANEOUS | 1 refills | Status: AC

## 2016-09-16 NOTE — Progress Notes (Signed)
Subjective:    Patient ID: Manuel Ayers, male    DOB: [birth year], 41 y.o.   MRN: [id number]  HPI  41 year old man is here today for hospital follow-up for abnormal glucose.   He says after I saw him for about 2 weeks he just started noticing he was urinating more he was extremely thirsty and then just started to feel very weak. Around that time he decided to start dieting and was eating mostly salads. He ended up going to the ED on 08/29/16 after starting to feel weak.  He was experiencing some polyuria and polydipsia for about 2 weeks at that point. He had been getting up frequently at night to urinate.. They evaluated him for about 8-9 hours and then released him. His glucoses were greater than 500 at that time. He says since then he still had extremely low energy and he still been getting up eyes every hour at night to urinate.He has been trying to eat better overall since he found out his sugars were elevated. Yesterday he says he ate a sausage biscuit and a couple more and she is in the morning. 4 a early lunch she had crackers and cheese. Then on his way home from the [facility] he stopped at [employer] and got a sandwich. He had been experiencing polyuria and polydipsia.  Review of Systems  BP 118/70   Pulse 88   Wt 291 lb (132 kg)   SpO2 98%   BMI 39.47 kg/m     Allergies  Allergen Reactions  . Lisinopril Cough  . Losartan Other (See Comments)    Sexual dysfunction.     No past medical history on file.  No past surgical history on file.  Social History   Social History  . Marital status: Married    Spouse name: N/A  . Number of children: N/A  . Years of education: N/A   Occupational History  . Not on file.   Social History Main Topics  . Smoking status: Former Research scientist (life sciences)  . Smokeless tobacco: Never Used  . Alcohol use No  . Drug use: No  . Sexual activity: Not on file     Comment: no rerular exercise   Other Topics Concern  . Not on file   Social History Narrative  . No  narrative on file    Family History  Problem Relation Age of Onset  . Cancer Mother   . Heart attack Unknown        uncle  . Diabetes Father   . Hypertension Father   . Diabetes Sister   . Hypertension Sister   . Hypertension Brother   . Kidney failure Sister     Outpatient Encounter Prescriptions as of 09/16/2016  Medication Sig  . cyclobenzaprine (FLEXERIL) 5 MG tablet Take 1-2 tablets (5-10 mg total) by mouth 2 (two) times daily as needed for muscle spasms.  . meloxicam (MOBIC) 15 MG tablet Take 1 tablet (15 mg total) by mouth daily.  . metFORMIN (GLUCOPHAGE) 500 MG tablet Take 500 mg by mouth 2 (two) times daily.  Marland Kitchen olmesartan (BENICAR) 20 MG tablet Take 1 tablet (20 mg total) by mouth daily.  . pravastatin (PRAVACHOL) 20 MG tablet Take 1 tablet (20 mg total) by mouth daily. Appointment required for future refills.  . AMBULATORY NON FORMULARY MEDICATION Dispense based on patient's insurance.  Lancets for checking blood sugars three times daily [id number]  . blood glucose meter kit and supplies KIT Dispense based on patient and insurance preference.  Use up to four times daily as directed. (FOR ICD-9 250.00, 250.01).  . Insulin Glargine (TOUJEO SOLOSTAR) 300 UNIT/ML SOPN Inject 15-30 Units into the skin at bedtime.  . [DISCONTINUED] Insulin Glargine (TOUJEO MAX SOLOSTAR) 300 UNIT/ML SOPN Inject 15-30 Units into the skin at bedtime.   No facility-administered encounter medications on file as of 09/16/2016.          Objective:   Physical Exam  Constitutional: He is oriented to person, place, and time. He appears well-developed and well-nourished.  HENT:  Head: Normocephalic and atraumatic.  Cardiovascular: Normal rate, regular rhythm and normal heart sounds.   Pulmonary/Chest: Effort normal and breath sounds normal.  Neurological: He is alert and oriented to person, place, and time.  Skin: Skin is warm and dry.  Psychiatric: He has a normal mood and affect. His behavior is  normal.        Assessment & Plan:  New diagnosis of diabetes, hemoglobin A1c greater than 14. Will refer for nutrition counseling. We'll get him a glucometer with lancets and strips.We'll go ahead and place referral to diabetes and nutrition. Given a sample of Toujeo and demonstrated on how to use the device. Have him start with 15 units and then go up 1 unit every 2 days until his blood sugars are under 130. I'll see him back in about 3-4 weeks. Given some additional information about good food choices.  Time spent 30 minutes, greater than 50% time spent counseling about new diagnosis and plan for care.

## 2016-09-16 NOTE — Patient Instructions (Addendum)
Recommend test blood sugar 3 days a week before breakfast. Bring in sugar log at next Office visit.  Work on low [place] diet.   Start Toujeo with 15 units. Every 2 days go up by 1 unit  On the toujeo if your sugar in the morning is > 130.     Diabetes Mellitus and Food It is important for you to manage your blood sugar (glucose) level. Your blood glucose level can be greatly affected by what you eat. Eating healthier foods in the appropriate amounts throughout the day at about the same time each day will help you control your blood glucose level. It can also help slow or prevent worsening of your diabetes mellitus. Healthy eating may even help you improve the level of your blood pressure and reach or maintain a healthy weight. General recommendations for healthful eating and cooking habits include:  Eating meals and snacks regularly. Avoid going long periods of time without eating to lose weight.  Eating a diet that consists mainly of plant-based foods, such as fruits, vegetables, nuts, legumes, and whole grains.  Using low-heat cooking methods, such as baking, instead of high-heat cooking methods, such as deep frying.  Work with your dietitian to make sure you understand how to use the Nutrition Facts information on food labels. How can food affect me? Carbohydrates Carbohydrates affect your blood glucose level more than any other type of food. Your dietitian will help you determine how many carbohydrates to eat at each meal and teach you how to count carbohydrates. Counting carbohydrates is important to keep your blood glucose at a healthy level, especially if you are using insulin or taking certain medicines for diabetes mellitus. Alcohol Alcohol can cause sudden decreases in blood glucose (hypoglycemia), especially if you use insulin or take certain medicines for diabetes mellitus. Hypoglycemia can be a life-threatening condition. Symptoms of hypoglycemia (sleepiness, dizziness, and  disorientation) are similar to symptoms of having too much alcohol. If your health care provider has given you approval to drink alcohol, do so in moderation and use the following guidelines:  Women should not have more than one drink per day, and men should not have more than two drinks per day. One drink is equal to: ? 12 oz of beer. ? 5 oz of wine. ? 1 oz of hard liquor.  Do not drink on an empty stomach.  Keep yourself hydrated. Have water, diet soda, or unsweetened iced tea.  Regular soda, juice, and other mixers might contain a lot of carbohydrates and should be counted.  What foods are not recommended? As you make food choices, it is important to remember that all foods are not the same. Some foods have fewer nutrients per serving than other foods, even though they might have the same number of calories or carbohydrates. It is difficult to get your body what it needs when you eat foods with fewer nutrients. Examples of foods that you should avoid that are high in calories and carbohydrates but low in nutrients include:  Trans fats (most processed foods list trans fats on the Nutrition Facts label).  Regular soda.  Juice.  Candy.  Sweets, such as cake, pie, doughnuts, and cookies.  Fried foods.  What foods can I eat? Eat nutrient-rich foods, which will nourish your body and keep you healthy. The food you should eat also will depend on several factors, including:  The calories you need.  The medicines you take.  Your weight.  Your blood glucose level.  Your blood  pressure level.  Your cholesterol level.  You should eat a variety of foods, including:  Protein. ? Lean cuts of meat. ? Proteins low in saturated fats, such as fish, egg whites, and beans. Avoid processed meats.  Fruits and vegetables. ? Fruits and vegetables that may help control blood glucose levels, such as apples, mangoes, and yams.  Dairy products. ? Choose fat-free or low-fat dairy products,  such as milk, yogurt, and cheese.  Grains, bread, pasta, and rice. ? Choose whole grain products, such as multigrain bread, whole oats, and brown rice. These foods may help control blood pressure.  Fats. ? Foods containing healthful fats, such as nuts, avocado, olive oil, canola oil, and fish.  Does everyone with diabetes mellitus have the same meal plan? Because every person with diabetes mellitus is different, there is not one meal plan that works for everyone. It is very important that you meet with a dietitian who will help you create a meal plan that is just right for you. This information is not intended to replace advice given to you by your health care provider. Make sure you discuss any questions you have with your health care provider. Document Released: 09/26/2004 Document Revised: 06/07/2015 Document Reviewed: 11/26/2012 Elsevier Interactive Patient Education  2017 [employer].

## 2016-09-17 MED ORDER — AMBULATORY NON FORMULARY MEDICATION: 3 refills | Status: AC

## 2016-09-19 ENCOUNTER — Other Ambulatory Visit: Payer: Self-pay | Admitting: *Deleted

## 2016-09-19 MED ORDER — INSULIN PEN NEEDLE 32G X 6 MM MISC: 11 refills | Status: AC

## 2016-09-30 ENCOUNTER — Telehealth: Payer: Self-pay | Admitting: *Deleted

## 2016-09-30 NOTE — Telephone Encounter (Signed)
Called pt and spoke with him about the cramping that he is having in his hand. He informed me that it's only in his L hand his fingertips go numb and it lasts all day long. He stated that the pain began 4 days ago.   Pt states his BS was 404 after eating dinner. And his BS was 286 yesterday morning, on 9/4 it was 408. The lowest has been 250.    Pt told to go up to 30 units of Toujeo tonight. And to check his BS twice daily. And to have his wife call us Friday with his BS. Pt voiced understanding. He has a f/u appt on 9/25. Advised to keep this appt, asked if he would like to have his labs done prior to his appt? He opted to wait. Pt wanted to know if ok to eat bananas told that ok to eat a small banana. Laureen Ochs, Viann Shove

## 2016-09-30 NOTE — Telephone Encounter (Signed)
What are his blood sugars running? Are they still high.  Insulin moves potassium into the cells so he could be low.  Only way to know would be to check his labs.  We can try to send an order for a BMP to a lab near his work.

## 2016-09-30 NOTE — Telephone Encounter (Signed)
Pt's wife called and stated that her husband has been c/o hand tightening, and cramping x 1 wk. She stated that its been hard for him to grasp and hold on to things. She also said that his legs have been cramping too. I asked her if this is constant, any pain associated? She stated that she would have to text him to ask him about those things and get back to me about this. Asked if he is drinking plenty of fluids she said that he does. That is what he mostly drinks. She wanted to know if it would be ok for him to take OTC potassium what dose or eat a banana or coconut water ok? Will fwd to pcp for advice.

## 2016-10-07 ENCOUNTER — Encounter: Payer: Self-pay | Admitting: Family Medicine

## 2016-10-07 ENCOUNTER — Ambulatory Visit (INDEPENDENT_AMBULATORY_CARE_PROVIDER_SITE_OTHER): Payer: PRIVATE HEALTH INSURANCE | Admitting: Family Medicine

## 2016-10-07 ENCOUNTER — Telehealth: Payer: Self-pay | Admitting: *Deleted

## 2016-10-07 VITALS — BP 128/79 | HR 70 | Ht 72.0 in | Wt 286.0 lb

## 2016-10-07 DIAGNOSIS — IMO0001 Reserved for inherently not codable concepts without codable children: Secondary | ICD-10-CM

## 2016-10-07 DIAGNOSIS — E1165 Type 2 diabetes mellitus with hyperglycemia: Secondary | ICD-10-CM | POA: Diagnosis not present

## 2016-10-07 DIAGNOSIS — R252 Cramp and spasm: Secondary | ICD-10-CM

## 2016-10-07 LAB — BASIC METABOLIC PANEL WITH GFR
BUN: 8 mg/dL (ref 7–25)
CHLORIDE: 105 mmol/L (ref 98–110)
CO2: 23 mmol/L (ref 20–32)
Calcium: 9.1 mg/dL (ref 8.6–10.3)
Creat: 0.82 mg/dL (ref 0.60–1.35)
GFR, EST AFRICAN AMERICAN: 126 mL/min/{1.73_m2} (ref >=60)
GFR, EST NON AFRICAN AMERICAN: 108 mL/min/{1.73_m2} (ref >=60)
GLUCOSE: 216 mg/dL — AB (ref 65–99)
POTASSIUM: 4.1 mmol/L (ref 3.5–5.3)
Sodium: 139 mmol/L (ref 135–146)

## 2016-10-07 LAB — POCT GLYCOSYLATED HEMOGLOBIN (HGB A1C): Hemoglobin A1C: 13.8

## 2016-10-07 MED ORDER — SITAGLIP PHOS-METFORMIN HCL ER 100-1000 MG PO TB24: 1.0000 | ORAL_TABLET | ORAL | 5 refills | Status: DC

## 2016-10-07 MED ORDER — GLUCOSE BLOOD VI STRP: ORAL_STRIP | 12 refills | Status: DC

## 2016-10-07 MED ORDER — GLUCOSE BLOOD VI STRP: ORAL_STRIP | 3 refills | Status: AC

## 2016-10-07 NOTE — Addendum Note (Signed)
Addended by: Nani Gasser D on: 10/07/2016 10:19 PM   Modules accepted: Orders

## 2016-10-07 NOTE — Telephone Encounter (Signed)
Manuel Ayers called and stated that pt does not have insurance and wanted to know if Dr. Linford Arnold may want to change this medication for him. Will fwd to pcp for advice.[name] [employer]

## 2016-10-07 NOTE — Patient Instructions (Signed)
Increase Toujeo to 40 units. Increase by 1 unit every 2 days until sugars under 120 fasting in the morning.   The Janumet will take place of the metformin.

## 2016-10-07 NOTE — Progress Notes (Addendum)
   Subjective:    Patient ID: Manuel Ayers, male    DOB: 09-03-1975, 41 y.o.   MRN: [id number]  HPI 4 week f/U for new diagnosis of uncontrolled DM.  He is up to 30 units of Toujeo.  He is still on metformin.  Sugars have been runningin the low 200s in the morning.  The highest is been 359 in the last week. He left his glucose log at home. He has been having some intermittent cramping in his hands.e had called here and there is a phone note regarding this. Time did not have any specific recommendations without being able to check blood work. He said he started increasing his water intake and noticed improvement.   Review of Systems     Objective:   Physical Exam  Constitutional: He is oriented to person, place, and time. He appears well-developed and well-nourished.  HENT:  Head: Normocephalic and atraumatic.  Cardiovascular: Normal rate, regular rhythm and normal heart sounds.   Pulmonary/Chest: Effort normal and breath sounds normal.  Neurological: He is alert and oriented to person, place, and time.  Skin: Skin is warm and dry.  Psychiatric: He has a normal mood and affect. His behavior is normal.          Assessment & Plan:  Diabetes-uncontrolled. Discussed options. For now increased to shareto 40 units and he him increase every other day until sugars ader 120. And stop metformin and switch to Janumet. I don't want to put him on SGL2 ecause he still having difficulty keeping enough fluids in and has been getting cramping. We may consider in the future.  Hand cramping  - improved with increased water intake. We'll check electrolytes today.

## 2016-10-08 MED ORDER — METFORMIN HCL 1000 MG PO TABS: 1000.0000 mg | ORAL_TABLET | Freq: Two times a day (BID) | ORAL | 1 refills | Status: DC

## 2016-10-08 MED ORDER — GLIPIZIDE ER 5 MG PO TB24: 5.0000 mg | ORAL_TABLET | Freq: Every day | ORAL | 1 refills | Status: AC

## 2016-10-08 NOTE — Telephone Encounter (Signed)
OK, will write for metformin and glipize seperately.  New order sent.

## 2016-10-08 NOTE — Progress Notes (Signed)
All labs are normal. 

## 2016-10-08 NOTE — Telephone Encounter (Signed)
Called and informed pt .Manuel Ayers  

## 2016-12-03 ENCOUNTER — Telehealth: Payer: Self-pay | Admitting: Family Medicine

## 2016-12-03 NOTE — Telephone Encounter (Signed)
Yes need to continue pill until I see him back.

## 2016-12-03 NOTE — Telephone Encounter (Signed)
Patient's wife called adv that pt's sugar has been low for past month lowest 79 and highest 115. Pt has stopped taking th shot but still takes metformin and request to know if he should still take the pill or not. Every since stop taking shot has not gone over 110 in weeks. Pt request a call back to be advised. Thanks

## 2016-12-08 NOTE — Telephone Encounter (Signed)
Pt advised. [place] back [place] and requested to have appt moved up, transferred to scheduling.

## 2016-12-11 ENCOUNTER — Other Ambulatory Visit: Payer: Self-pay

## 2016-12-11 DIAGNOSIS — E781 Pure hyperglyceridemia: Secondary | ICD-10-CM

## 2016-12-11 DIAGNOSIS — I10 Essential (primary) hypertension: Secondary | ICD-10-CM

## 2016-12-11 MED ORDER — PRAVASTATIN SODIUM 20 MG PO TABS: 20.0000 mg | ORAL_TABLET | Freq: Every day | ORAL | 0 refills | Status: DC

## 2016-12-11 MED ORDER — OLMESARTAN MEDOXOMIL 20 MG PO TABS: 20.0000 mg | ORAL_TABLET | Freq: Every day | ORAL | 0 refills | Status: DC

## 2016-12-11 MED ORDER — METFORMIN HCL 1000 MG PO TABS: 1000.0000 mg | ORAL_TABLET | Freq: Two times a day (BID) | ORAL | 0 refills | Status: AC

## 2016-12-12 ENCOUNTER — Ambulatory Visit: Payer: PRIVATE HEALTH INSURANCE | Admitting: Family Medicine

## 2016-12-30 ENCOUNTER — Ambulatory Visit: Payer: PRIVATE HEALTH INSURANCE | Admitting: Family Medicine

## 2017-01-22 ENCOUNTER — Ambulatory Visit: Payer: Self-pay | Admitting: Family Medicine

## 2017-02-03 ENCOUNTER — Ambulatory Visit: Payer: PRIVATE HEALTH INSURANCE | Admitting: Family Medicine

## 2017-02-03 DIAGNOSIS — Z0189 Encounter for other specified special examinations: Secondary | ICD-10-CM

## 2017-05-06 ENCOUNTER — Telehealth: Payer: Self-pay | Admitting: Family Medicine

## 2017-05-06 MED ORDER — IRBESARTAN 150 MG PO TABS: 150.0000 mg | ORAL_TABLET | Freq: Every day | ORAL | 0 refills | Status: AC

## 2017-05-06 NOTE — Telephone Encounter (Signed)
Received a message from [employer] at [facility] in [place] who stated that Benicar is on long term back order due to a plant shutting down and wants to know what alternative you recommend for this patient? Patient was last seen on 10/07/2016 and did not show in January. Please advise.

## 2017-05-06 NOTE — Telephone Encounter (Signed)
OK, new med sent.  

## 2017-05-07 NOTE — Telephone Encounter (Signed)
Attempted to notify Pt, number on file is disconnected.

## 2017-12-02 IMAGING — CT CT ABD-PELV W/ CM
2 of 5 series · 15 of 46 positions shown, 17 images · IV contrast (APPLIED)
Comparison: None.

CLINICAL DATA: Right-sided low back pain, occasional radiation to
left side. No known injury. Pain progressively worsening. Sudden
sharp pain in right side last week.

EXAM:
CT ABDOMEN AND PELVIS WITH CONTRAST
TECHNIQUE: Multidetector CT imaging of the abdomen and pelvis was performed
using the standard protocol following bolus administration of
intravenous contrast.
CONTRAST:  100mL [id number] IOPAMIDOL ([id number]) INJECTION 61%

[Series 2: axial st · axial · 0.98mm/px · z∈[-483,-13]mm · 12 of 106 slices shown, 14 images]
[im 6/106  soft-tissue]
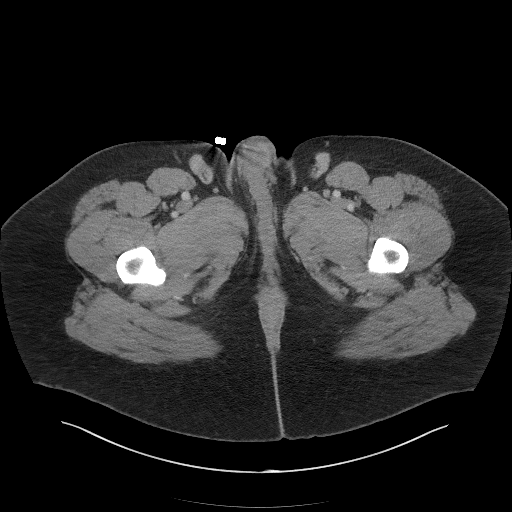
[im 6/106  bone]
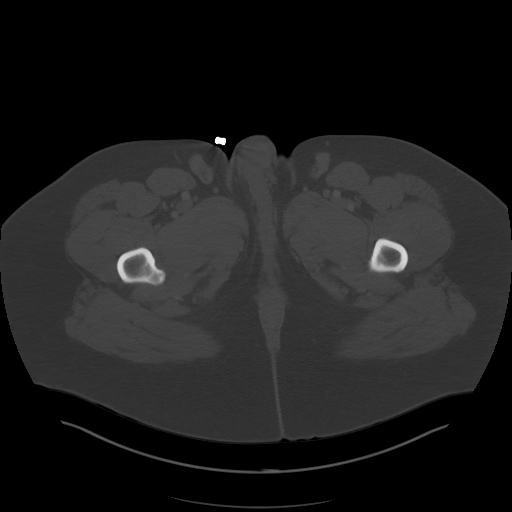
[im 16/106  soft-tissue]
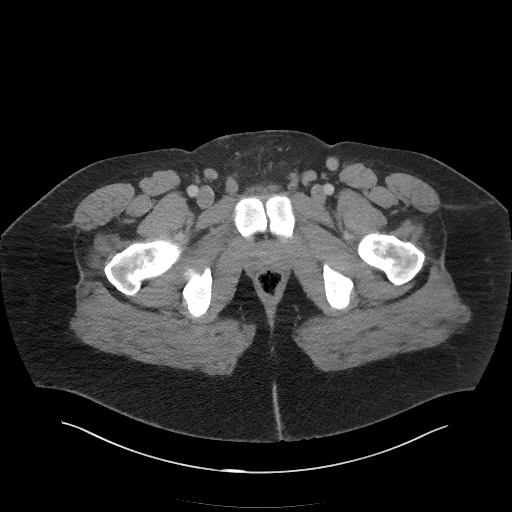
[im 22/106  soft-tissue]
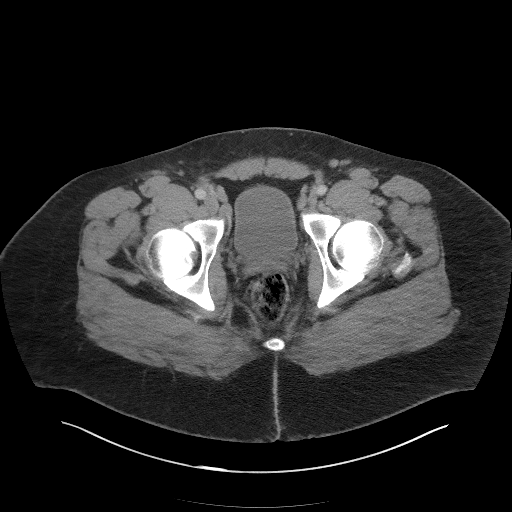
[im 32/106  soft-tissue]
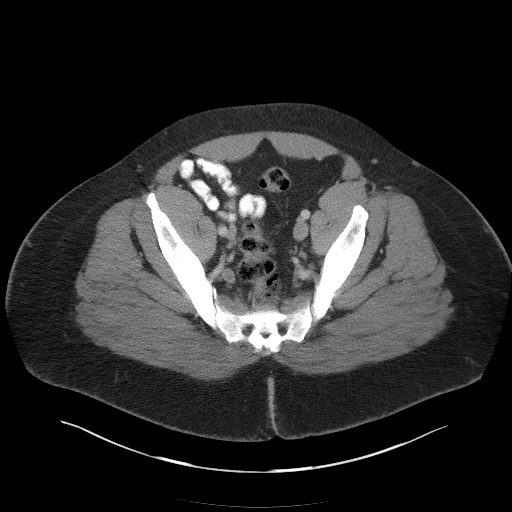
[im 43/106  soft-tissue]
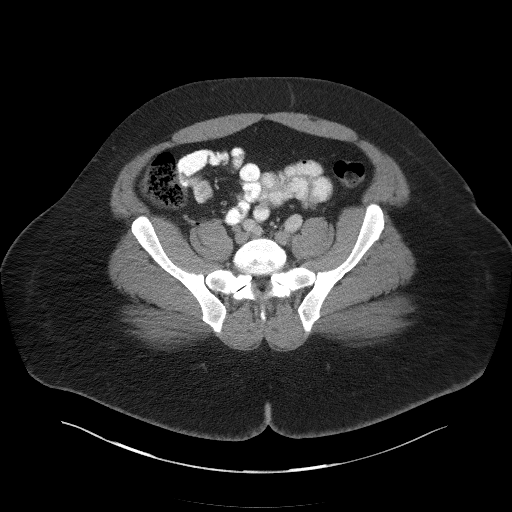
[im 48/106  soft-tissue]
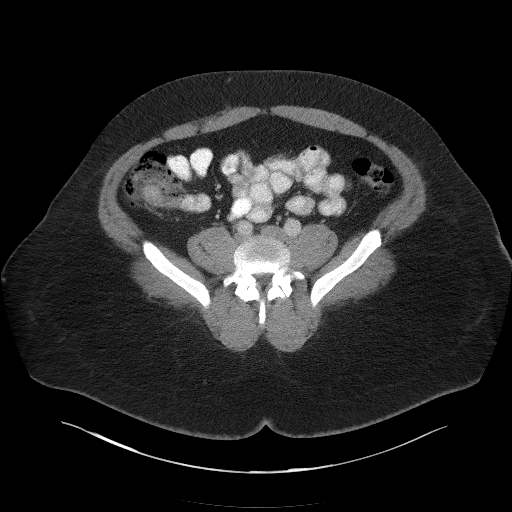
[im 58/106  soft-tissue]
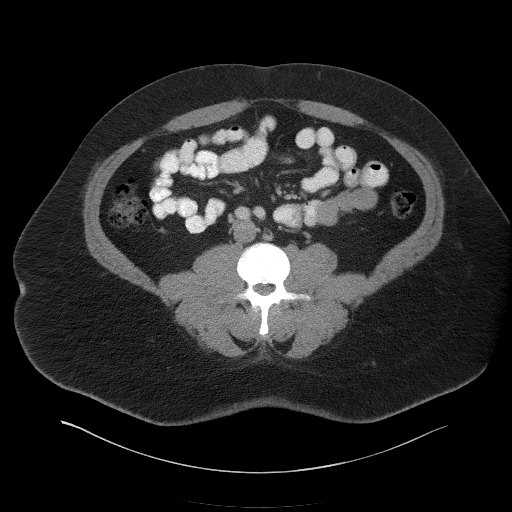
[im 64/106  soft-tissue]
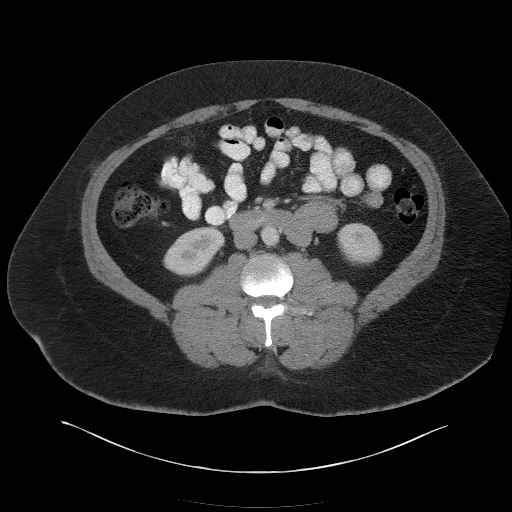
[im 74/106  soft-tissue]
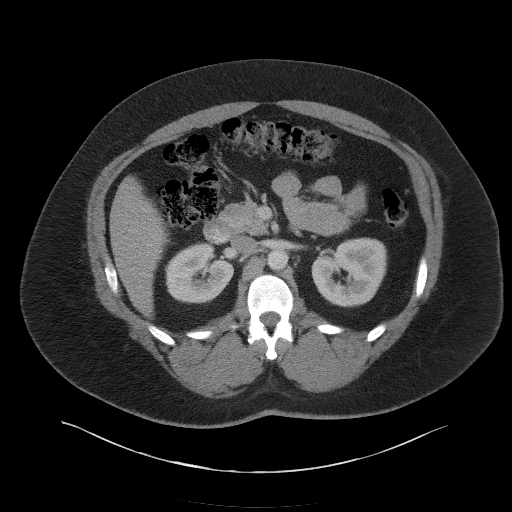
[im 74/106  bone]
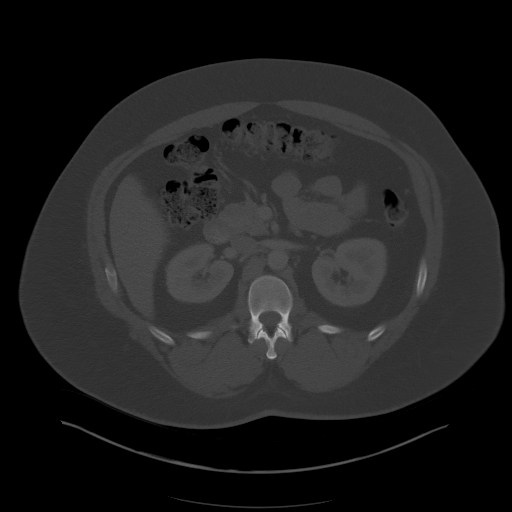
[im 85/106  soft-tissue]
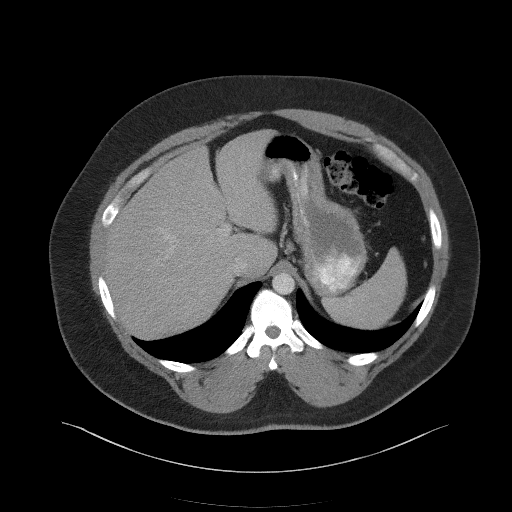
[im 90/106  soft-tissue]
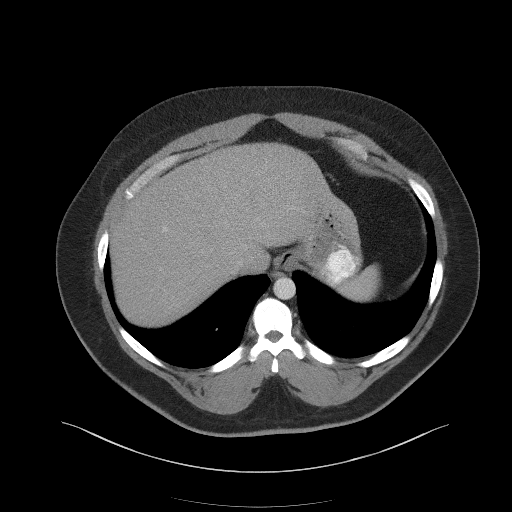
[im 100/106  soft-tissue]
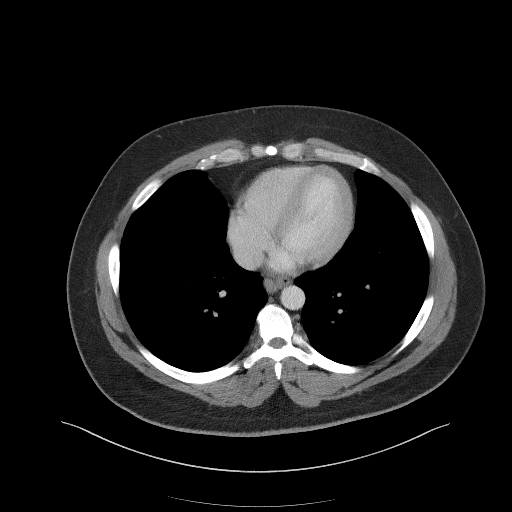

[Series 5: coronal st · coronal · 1.05mm/px · 3 of 112 slices shown]
[im 38/112  soft-tissue]
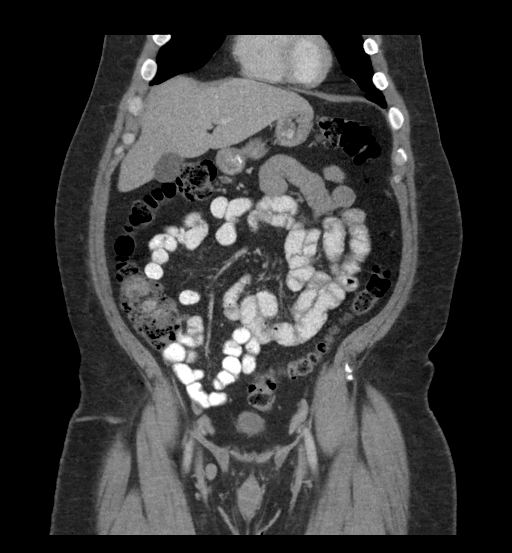
[im 50/112  soft-tissue]
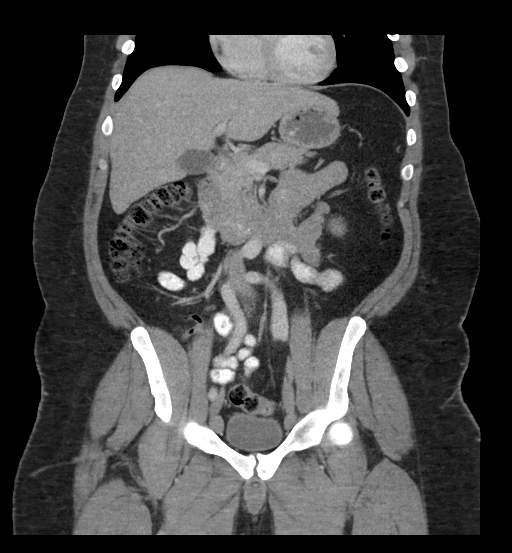
[im 62/112  soft-tissue]
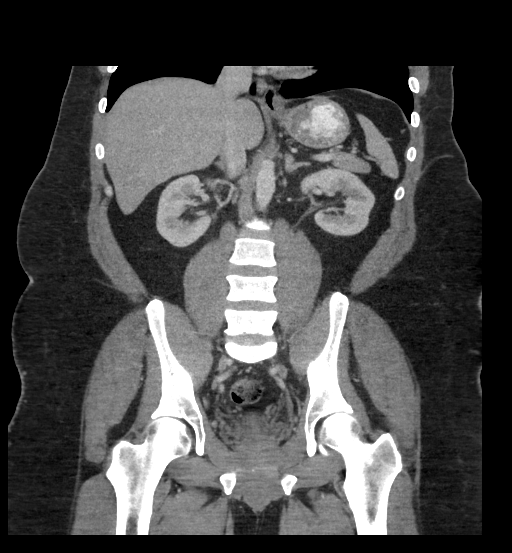

[15 of 46 positions shown; findings below may reference images not displayed]

FINDINGS: Lower chest:  No acute findings.

Hepatobiliary: Liver and gallbladder appear normal.

Pancreas: No mass, inflammatory changes, or other significant
abnormality.

Spleen: Within normal limits in size and appearance.

Adrenals/Urinary Tract: Adrenal glands appear normal. Kidneys appear
normal without mass, stone or hydronephrosis. No ureteral or bladder
calculi identified. Bladder appears normal, partially decompressed.

Stomach/Bowel: Bowel is normal in caliber. No bowel wall thickening
or evidence of bowel wall inflammation seen. Appendix is normal.

Vascular/Lymphatic: Abdominal aorta is normal in caliber and
configuration. No vascular abnormality seen.

Moderately enlarged lymph nodes are seen along the bilateral iliac
chain regions, largest measuring approximately 3 cm in greatest
dimension with short axis dimension measurements up to 1.7 cm.
Similar sized lymph nodes are seen within the bilateral groin
regions.

No enlarged lymph nodes seen within the more superior aspects of the
abdomen or within the retroperitoneum.

Reproductive: No mass or other significant abnormality.

Other: No free fluid or abscess collection identified. No free
intraperitoneal air.

Musculoskeletal: No acute or suspicious osseous finding. Mild
degenerative change within the lower lumbar spine, with osseous
neural foramen narrowings bilaterally at the L5-S1 level with
probable associated nerve root impingements.

Small periumbilical abdominal wall hernia which contains fat only.
Superficial soft tissues are unremarkable.
IMPRESSION: 1. Mild degenerative change in the lower lumbar spine. However, a
combination of mild disc bulge and degenerative facet arthropathy at
L5-S1 is resulting in bilateral neural foramen narrowings which are
at least moderate in degree with probable associated nerve root
impingements. Given that the patient's symptoms could be
radiculopathic in nature, would consider nonemergent lumbar spine
MRI for further characterization of a possible nerve root
impingement. No acute-appearing osseous abnormality.
2. Moderately enlarged lymph nodes within the bilateral iliac chain
regions and bilateral groin regions. This is of uncertain
significance. No enlarged lymph nodes elsewhere within the abdomen
or retroperitoneum. Consider directed soft tissue ultrasound of the
lymph nodes in the groin regions. At minimum, recommend a follow-up
pelvis CT in 2-3 months to ensure stability or resolution.
3. Remainder of the abdomen and pelvis CT is unremarkable, as
detailed above.

## 2017-12-28 ENCOUNTER — Other Ambulatory Visit: Payer: Self-pay | Admitting: Family Medicine

## 2017-12-28 DIAGNOSIS — E781 Pure hyperglyceridemia: Secondary | ICD-10-CM
